# Patient Record
Sex: Female | Born: 1970 | Race: Black or African American | Hispanic: No | Marital: Married | Smoking: Never smoker
Health system: Southern US, Community
[De-identification: ages and names within clinical notes are randomized; demographics above are authoritative.]

## PROBLEM LIST (undated history)

## (undated) DIAGNOSIS — K603 Anal fistula, unspecified: Secondary | ICD-10-CM

## (undated) DIAGNOSIS — I10 Essential (primary) hypertension: Secondary | ICD-10-CM

## (undated) HISTORY — DX: Anal fistula: K60.3

## (undated) HISTORY — DX: Anal fistula, unspecified: K60.30

## (undated) HISTORY — PX: ABDOMINAL HYSTERECTOMY: SHX81

## (undated) HISTORY — DX: Essential (primary) hypertension: I10

---

## 2009-01-20 ENCOUNTER — Emergency Department (HOSPITAL_COMMUNITY): Admission: EM | Admit: 2009-01-20 | Discharge: 2009-01-20 | Payer: Self-pay | Admitting: Emergency Medicine

## 2009-01-26 ENCOUNTER — Emergency Department (HOSPITAL_COMMUNITY): Admission: EM | Admit: 2009-01-26 | Discharge: 2009-01-26 | Payer: Self-pay | Admitting: Emergency Medicine

## 2009-02-06 ENCOUNTER — Other Ambulatory Visit: Admission: RE | Admit: 2009-02-06 | Discharge: 2009-02-06 | Payer: Self-pay | Admitting: Family Medicine

## 2009-04-10 ENCOUNTER — Ambulatory Visit: Payer: Self-pay | Admitting: *Deleted

## 2009-04-10 ENCOUNTER — Emergency Department (HOSPITAL_COMMUNITY): Admission: EM | Admit: 2009-04-10 | Discharge: 2009-04-10 | Payer: Self-pay | Admitting: Emergency Medicine

## 2009-04-10 ENCOUNTER — Inpatient Hospital Stay (HOSPITAL_COMMUNITY): Admission: RE | Admit: 2009-04-10 | Discharge: 2009-04-13 | Payer: Self-pay | Admitting: *Deleted

## 2009-05-23 ENCOUNTER — Emergency Department (HOSPITAL_COMMUNITY): Admission: EM | Admit: 2009-05-23 | Discharge: 2009-05-23 | Payer: Self-pay | Admitting: Family Medicine

## 2009-09-04 ENCOUNTER — Emergency Department (HOSPITAL_COMMUNITY): Admission: EM | Admit: 2009-09-04 | Discharge: 2009-09-04 | Payer: Self-pay | Admitting: Emergency Medicine

## 2009-10-04 ENCOUNTER — Emergency Department (HOSPITAL_COMMUNITY): Admission: EM | Admit: 2009-10-04 | Discharge: 2009-10-04 | Payer: Self-pay | Admitting: Family Medicine

## 2010-02-08 ENCOUNTER — Ambulatory Visit (HOSPITAL_BASED_OUTPATIENT_CLINIC_OR_DEPARTMENT_OTHER): Admission: RE | Admit: 2010-02-08 | Discharge: 2010-02-08 | Payer: Self-pay | Admitting: General Surgery

## 2010-07-29 ENCOUNTER — Emergency Department (HOSPITAL_COMMUNITY)
Admission: EM | Admit: 2010-07-29 | Discharge: 2010-07-29 | Payer: Self-pay | Source: Home / Self Care | Admitting: Emergency Medicine

## 2010-08-14 ENCOUNTER — Emergency Department (HOSPITAL_BASED_OUTPATIENT_CLINIC_OR_DEPARTMENT_OTHER)
Admission: EM | Admit: 2010-08-14 | Discharge: 2010-08-14 | Payer: Self-pay | Source: Home / Self Care | Admitting: Emergency Medicine

## 2010-08-17 ENCOUNTER — Emergency Department (HOSPITAL_COMMUNITY)
Admission: EM | Admit: 2010-08-17 | Discharge: 2010-08-17 | Payer: Self-pay | Source: Home / Self Care | Admitting: Family Medicine

## 2010-11-03 LAB — BASIC METABOLIC PANEL
Calcium: 8.8 mg/dL (ref 8.4–10.5)
Chloride: 105 mEq/L (ref 96–112)
Creatinine, Ser: 0.64 mg/dL (ref 0.4–1.2)
GFR calc Af Amer: >60 mL/min (ref >60)
GFR calc non Af Amer: >60 mL/min (ref >60)
Sodium: 138 mEq/L (ref 135–145)

## 2010-11-03 LAB — GLUCOSE, CAPILLARY

## 2010-11-13 ENCOUNTER — Inpatient Hospital Stay (INDEPENDENT_AMBULATORY_CARE_PROVIDER_SITE_OTHER)
Admission: RE | Admit: 2010-11-13 | Discharge: 2010-11-13 | Disposition: A | Discharge status: Home / Self Care | Payer: Self-pay | Source: Ambulatory Visit | Attending: Family Medicine | Admitting: Family Medicine

## 2010-11-13 DIAGNOSIS — J45909 Unspecified asthma, uncomplicated: Secondary | ICD-10-CM

## 2010-11-23 LAB — CBC
HCT: 40.4 % (ref 36.0–46.0)
MCHC: 33.4 g/dL (ref 30.0–36.0)
Platelets: 280 10*3/uL (ref 150–400)
RBC: 4.62 MIL/uL (ref 3.87–5.11)
RDW: 14.1 % (ref 11.5–15.5)

## 2010-11-23 LAB — URINALYSIS, ROUTINE W REFLEX MICROSCOPIC
Glucose, UA: NEGATIVE mg/dL
Hgb urine dipstick: NEGATIVE
Ketones, ur: 40 mg/dL — AB
Specific Gravity, Urine: 1.025 (ref 1.005–1.030)
Urobilinogen, UA: 0.2 mg/dL (ref 0.0–1.0)

## 2010-11-23 LAB — BASIC METABOLIC PANEL
BUN: 7 mg/dL (ref 6–23)
Creatinine, Ser: 0.61 mg/dL (ref 0.4–1.2)
GFR calc Af Amer: >60 mL/min (ref >60)
Sodium: 137 mEq/L (ref 135–145)

## 2010-11-23 LAB — DIFFERENTIAL
Basophils Relative: 0 % (ref 0–1)
Eosinophils Absolute: 0.3 10*3/uL (ref 0.0–0.7)
Lymphs Abs: 2.9 10*3/uL (ref 0.7–4.0)
Monocytes Relative: 7 % (ref 3–12)

## 2010-11-23 LAB — RAPID URINE DRUG SCREEN, HOSP PERFORMED
Amphetamines: NOT DETECTED
Tetrahydrocannabinol: NOT DETECTED

## 2010-11-23 LAB — POCT PREGNANCY, URINE: Preg Test, Ur: NEGATIVE

## 2010-11-23 LAB — ETHANOL: Alcohol, Ethyl (B): 6 mg/dL (ref 0–10)

## 2010-11-29 ENCOUNTER — Inpatient Hospital Stay (INDEPENDENT_AMBULATORY_CARE_PROVIDER_SITE_OTHER)
Admission: RE | Admit: 2010-11-29 | Discharge: 2010-11-29 | Disposition: A | Discharge status: Home / Self Care | Payer: Self-pay | Source: Ambulatory Visit | Attending: Family Medicine | Admitting: Family Medicine

## 2010-11-29 DIAGNOSIS — J45909 Unspecified asthma, uncomplicated: Secondary | ICD-10-CM

## 2010-12-11 ENCOUNTER — Emergency Department (HOSPITAL_COMMUNITY)
Admission: EM | Admit: 2010-12-11 | Discharge: 2010-12-11 | Disposition: A | Discharge status: Home / Self Care | Payer: Self-pay | Attending: Emergency Medicine | Admitting: Emergency Medicine

## 2010-12-11 ENCOUNTER — Emergency Department (HOSPITAL_COMMUNITY): Payer: Self-pay

## 2010-12-11 DIAGNOSIS — J45909 Unspecified asthma, uncomplicated: Secondary | ICD-10-CM | POA: Insufficient documentation

## 2010-12-11 DIAGNOSIS — IMO0002 Reserved for concepts with insufficient information to code with codable children: Secondary | ICD-10-CM | POA: Insufficient documentation

## 2010-12-11 DIAGNOSIS — I10 Essential (primary) hypertension: Secondary | ICD-10-CM | POA: Insufficient documentation

## 2010-12-11 DIAGNOSIS — Y929 Unspecified place or not applicable: Secondary | ICD-10-CM | POA: Insufficient documentation

## 2010-12-11 DIAGNOSIS — M25559 Pain in unspecified hip: Secondary | ICD-10-CM | POA: Insufficient documentation

## 2010-12-11 DIAGNOSIS — X58XXXA Exposure to other specified factors, initial encounter: Secondary | ICD-10-CM | POA: Insufficient documentation

## 2010-12-31 NOTE — Discharge Summary (Signed)
[name], [name]              ACCOUNT NO.:  [id number]   MEDICAL RECORD NO.:  [id number]          PATIENT TYPE:  IPS   LOCATION:  0307                          FACILITY:  [facility]   PHYSICIAN:  Jasmine Pang, M.D. DATE OF BIRTH:  08/02/1971   DATE OF ADMISSION:  04/10/2009  DATE OF DISCHARGE:  04/13/2009                               DISCHARGE SUMMARY   IDENTIFICATION:  This is a 40 year old single African American female  from [place], who was admitted on a voluntary basis.   HISTORY OF PRESENT ILLNESS:  The patient presents with suicidal ideation  and a plan to overdose on pills.  She reported feeling depressed for the  past 8-9 months.  She is tearful and quiet and reserved, but is  cooperative.  Stressors include her mother and uncle and grandmother's  death within the past few years.  She was very close to all of them.  She is also having financial problems, occupational problems, and family  conflict.  The latter revolves around her being in a relationship with a  same sex partner and her children and other family members not agreeing  with this.  The patient states she just moved from [place] in April.  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  There were no drugs or alcohol use.  She has had  no past psychiatric history.  She has never been on any psychiatric  medicines.  The patient notes that she has been having problems with her  irritability and has felt that her moods have been swinging, somewhat  between being very depressed and becoming easily irritated and  irritable. She states this is not usual for her because she is normally  a calm person.  For further admission information, see psychiatric  admission assessment. Initially, an Axis I diagnosis of mood disorder  NOS was given.  On Axis III, she was diagnosed with seasonal allergies.   PHYSICAL FINDINGS:  There were no acute physical or medical problems  noted.  Complete physical exam was done in the ED.   ADMISSION LABORATORY:  Urine drug screen was negative.  BMET was  unremarkable except for slightly decreased potassium of 3.4.  Alcohol  level was negative.  Urinalysis was within normal limits except for 40  ketones.  She states she has not been eating lately.  Urine pregnancy  test was negative.  CBC with differential was grossly within normal  limits.   HOSPITAL COURSE:  Upon admission, the patient was started on Ambien 10  mg p.o. q.h.s. p.r.n. insomnia and Zyrtec 10 mg p.o. daily p.r.n.,  Ativan 0.5 mg p.o. q.4 h. p.r.n. anxiety.  Initially in individual  sessions, the patient presented as a casually dressed, cooperative, but  reserved [place] female who had fair eye contact.  There was  psychomotor retardation.  Speech was soft and flow.  She was somewhat  lethargic because she had been lying in bed.  Mood was depressed,  anxious, and hopeless.  Affect was consistent with mood.  There  was  positive suicidal ideation.  No homicidal ideation.  Thoughts were  logical and goal-directed.  Thought content, no predominant theme.  No  auditory or visual hallucination.  No paranoia or delusions.  Insight  good.  Judgment good.  Impulse control good.  The patient was started on  Celexa 20 mg p.o. q.h.s. and Abilify 5 mg p.o. q.h.s.  As  hospitalization progressed, mental status improved.  She became less  depressed, less anxious.  She began to attend unit therapeutic groups  and activity.  She attended discharge group and made plans for her to be  seen at [facility] counseling center.  She discussed her family  situation with me; she has a very supportive partner; she was concerned  about her grandchildren, who she has custody of.  She was wanting to go  home very soon.  It was decided to go home tomorrow.  On April 13, 2009, mental status had improved markedly from admission status.  The  patient was sleeping well and eating  well.  Mood was less depressed,  less anxious.  Affect was consistent with mood.  There was no suicidal  or homicidal ideation.  No auditory or visual hallucination.  No  paranoia or delusion.  Thoughts were logical and goal-directed.  Thought  content, no predominant theme.  Cognitive was grossly intact.  Insight  good.  Judgment good.  Impulse control good.  She was having no side  effects to her medication.  She was wanting to go home today and felt to  be safe for discharge.  Her partner was going to pick her up.   DISCHARGE DIAGNOSES:  Axis I:  Mood disorder not otherwise specified.  Axis II:  None.  Axis III:  Seasonal allergies.  Axis IV:  Severe (conflict with family, death of close family members,  financial problems, occupational problem, and burden of psychiatric  illness).  Axis V:  Global assessment of functioning was 50 upon discharge.  GAF  was 38 upon admission.  GAF highest past year was 70-75.   DISCHARGE PLAN:  There was no specific activity level or dietary  restriction.   POSTHOSPITAL CARE PLANS:  The patient will go to [facility] counseling  center on April 17, 2009 at 2 p.m. to see Saul Fordyce, nurse  practitioner.  She will also begin counseling at the center.   DISCHARGE MEDICATIONS:  1. Celexa 20 mg at bedtime.  2. Abilify 5 mg at bedtime.  3. Ambien 10 mg at bedtime.  4. Lorazepam 0.5 mg every 6 hours if needed for anxiety.  5. Zyrtec as directed.      Jasmine Pang, M.D.  Electronically Signed     BHS/MEDQ  D:  04/13/2009  T:  04/14/2009  Job:  [id number]

## 2010-12-31 NOTE — H&P (Signed)
[name], [name]              ACCOUNT NO.:  [id number]   MEDICAL RECORD NO.:  [id number]          PATIENT TYPE:  IPS   LOCATION:  0307                          FACILITY:  [facility]   PHYSICIAN:  Jasmine Pang, M.D. DATE OF BIRTH:  06-Dec-1970   DATE OF ADMISSION:  04/10/2009  DATE OF DISCHARGE:                       PSYCHIATRIC ADMISSION ASSESSMENT   IDENTIFICATION:  This is a 40 year old single African American female  from [place] who was admitted on a voluntary basis.   HISTORY OF PRESENT ILLNESS:  The patient presents with suicidal ideation  and a plan to overdose on pills.  She reported feeling depressed for the  past 8 to 9 months.  She is tearful and quiet and reserved, but is  cooperative.  Stressors include her mother and uncle and grandmother's  death within the past few years.  She was very close to all of them.  She also is having financial problems, occupational problems, and family  conflict.  The ladder revolves around her being in a relationship with  the same sex partner and her children and other family members not  agreeing with this.  The patient states she just moved [place] from  [place] in April.  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  There was no drugs or alcohol use.   PAST PSYCHIATRIC HISTORY:  None.  She has never been on any psychiatric  medication.  The patient notes that she was having problems with her  irritability and has felt that her moods have been swimming somewhat  between being very depressed and becoming easily irritated and  irritable.  She states this is not usual for her because she is normally  a very calm person.   ALCOHOL AND DRUG HISTORY:  None.   SOCIAL HISTORY:  The patient was living in [place] until April.  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  She had stopped this due to  depression and a lot of memories of her family members death.  Apparently, she worked right across the  street from (old family  graveyard).  She then moved to [facility] and is working at a day care  center.  She states she wants to get on her feet and go back to running  a day care center again.  She is the mother of a 11 year old and 19-year-  old.  The 52 year old has 2 children, ages 42 and 2 months and she is  currently taking care of the grandchildren.  She states her daughter is  not doing well.  She did not go into detail about what her problems  were.   FAMILY HISTORY:  The patient denies any history of mood problems or  alcohol or drug abuse.   MEDICAL PROBLEMS:  Seasonal allergies.   MEDICATIONS:  Zyrtec 10 mg p.o. q. day p.r.n.   ALLERGIES:  No known drug allergies.   PHYSICAL FINDINGS:  There were no acute physical or medical problems  noted in the ED.   ADMISSION LABORATORY DATA:  Urine drug screen was negative.  BMET  was  unremarkable except for slightly decreased potassium of 3.4.  Alcohol  level was negative.  Urinalysis was within normal limits except for 40  ketones.  She states she has not been eating lately.  Urine pregnancy  test was negative.  CBC with differential was grossly within normal  limits.   MENTAL STATUS EXAM:  The patient presented as a casually dressed,  cooperative, but reserved, African American female who had fair eye  contact.  There was psychomotor retardation.  Speech was soft and slow.  She was somewhat lethargic because she had been lying in bed.  Mood was  depressed, anxious, hopeless.  Affect was consistent with mood.  There  was positive suicidal ideation.  No homicidal ideation.  Thoughts were  logical and goal-directed.  Thought content no predominant theme.  No  auditory or visual hallucinations.  No paranoia or delusions.  Insight  good.  Judgment good.  Impulse control good.   ADMISSION DIAGNOSES:  Axis I:  Mood disorder, not otherwise specified.  Axis II:  Deferred.  Axis III:  Seasonal allergies.  Axis IV:  Severe (conflict  with family, deaths close family members,  financial problems, occupational problems).  Axis V:  Global assessment of functioning 38 upon admission.  Highest  past year was 70 to 75.   POSTHOSPITAL CARE PLANS:  The patient will be referred for therapy and  psychiatric medication management by our case manager possibly to the  [facility].   ANTICIPATED LENGTH OF STAY:  3 to 5 days.   CONDITION NECESSARY FOR DISCHARGE:  Not suicidal.  Less depressed and  more functional.   HOSPITAL PLANS:  The patient will be started on Celexa 20 mg daily and  Abilify 5 mg daily.  She will be involved in unit therapeutic groups and  activities.  She will be in the blue group (for depression).  We will  have a family session with her and her partner.      Jasmine Pang, M.D.  Electronically Signed     BHS/MEDQ  D:  04/11/2009  T:  04/12/2009  Job:  [id number]

## 2011-02-22 ENCOUNTER — Inpatient Hospital Stay (INDEPENDENT_AMBULATORY_CARE_PROVIDER_SITE_OTHER)
Admission: RE | Admit: 2011-02-22 | Discharge: 2011-02-22 | Disposition: A | Discharge status: Home / Self Care | Payer: Self-pay | Source: Ambulatory Visit | Attending: Family Medicine | Admitting: Family Medicine

## 2011-02-22 DIAGNOSIS — J309 Allergic rhinitis, unspecified: Secondary | ICD-10-CM

## 2011-09-01 ENCOUNTER — Ambulatory Visit: Payer: Worker's Compensation

## 2011-09-04 ENCOUNTER — Ambulatory Visit: Payer: Worker's Compensation

## 2011-09-06 ENCOUNTER — Ambulatory Visit: Payer: Worker's Compensation

## 2011-09-11 ENCOUNTER — Emergency Department (INDEPENDENT_AMBULATORY_CARE_PROVIDER_SITE_OTHER)
Admission: EM | Admit: 2011-09-11 | Discharge: 2011-09-11 | Disposition: A | Discharge status: Home / Self Care | Payer: BC Managed Care – PPO | Source: Home / Self Care | Attending: Family Medicine | Admitting: Family Medicine

## 2011-09-11 ENCOUNTER — Telehealth (HOSPITAL_COMMUNITY): Payer: Self-pay | Admitting: *Deleted

## 2011-09-11 ENCOUNTER — Encounter (HOSPITAL_COMMUNITY): Payer: Self-pay

## 2011-09-11 DIAGNOSIS — J329 Chronic sinusitis, unspecified: Secondary | ICD-10-CM

## 2011-09-11 MED ORDER — AZITHROMYCIN 250 MG PO TABS: 250.0000 mg | ORAL_TABLET | Freq: Every day | ORAL | Status: AC

## 2011-09-11 MED ORDER — GUAIFENESIN-CODEINE 100-10 MG/5ML PO SYRP: 5.0000 mL | ORAL_SOLUTION | Freq: Four times a day (QID) | ORAL | Status: AC | PRN

## 2011-09-11 MED ORDER — ONDANSETRON HCL 4 MG PO TABS: 4.0000 mg | ORAL_TABLET | Freq: Three times a day (TID) | ORAL | Status: AC | PRN

## 2011-09-11 NOTE — ED Notes (Signed)
C/o productive cough of green sputum, sore throat, body aches that started last pm.  States she also vomited x 1 last night.  Denies diarrhea

## 2011-09-11 NOTE — ED Provider Notes (Signed)
History     CSN: [id number]  Arrival date & time 09/11/11  0820   First MD Initiated Contact with Patient 09/11/11 [phone]      Chief Complaint  Patient presents with  . Cough    (Consider location/radiation/quality/duration/timing/severity/associated sxs/prior treatment) HPI Comments: Armina presents for evaluation of persistent productive cough, fever, and nausea over the last few days. She reports a similar episode several weeks ago that resolved without seeing a medical provider. She does not smoke, and denies any sick contacts. She does have asthma and HTN and uses albuterol PRN.   Patient is a 41 y.o. female presenting with cough. The history is provided by the patient.  Cough This is a new problem. The current episode started more than 2 days ago. The problem occurs constantly. The problem has not changed since onset.The cough is productive of sputum. The maximum temperature recorded prior to her arrival was 101 to 101.9 F. Associated symptoms include rhinorrhea and shortness of breath. Pertinent negatives include no chest pain, no sore throat and no wheezing. She is not a smoker.    Past Medical History  Diagnosis Date  . Asthma     Past Surgical History  Procedure Date  . Abdominal hysterectomy     No family history on file.  History  Substance Use Topics  . Smoking status: Never Smoker   . Smokeless tobacco: Not on file  . Alcohol Use: No    OB History    Grav Para Term Preterm Abortions TAB SAB Ect Mult Living                  Review of Systems  Constitutional: Negative.   HENT: Positive for congestion and rhinorrhea. Negative for sore throat and sinus pressure.   Eyes: Negative.   Respiratory: Positive for cough and shortness of breath. Negative for wheezing.   Cardiovascular: Negative.  Negative for chest pain.  Gastrointestinal: Positive for nausea. Negative for vomiting.  Genitourinary: Negative.   Musculoskeletal: Negative.   Skin: Negative.     Neurological: Negative.     Allergies  Review of patient's allergies indicates no known allergies.  Home Medications   Current Outpatient Rx  Name Route Sig Dispense Refill  . ALBUTEROL IN Inhalation Inhale into the lungs as needed.    . AZITHROMYCIN 250 MG PO TABS Oral Take 1 tablet (250 mg total) by mouth daily. Take two tablets on first day, then one tablet each day for four days 6 tablet 0  . GUAIFENESIN-CODEINE 100-10 MG/5ML PO SYRP Oral Take 5 mLs by mouth every 6 (six) hours as needed for cough or congestion. 120 mL 0  . ONDANSETRON HCL 4 MG PO TABS Oral Take 1 tablet (4 mg total) by mouth every 8 (eight) hours as needed for nausea. 15 tablet 0    BP 158/92  Pulse 101  Temp(Src) 100.1 F (37.8 C) (Oral)  Resp 18  SpO2 96%  Physical Exam  Nursing note and vitals reviewed. Constitutional: She is oriented to person, place, and time. She appears well-developed and well-nourished.  HENT:  Head: Normocephalic and atraumatic.  Right Ear: Tympanic membrane is retracted.  Left Ear: Tympanic membrane is retracted.  Mouth/Throat: Uvula is midline, oropharynx is clear and moist and mucous membranes are normal.  Eyes: EOM are normal.  Neck: Normal range of motion.  Pulmonary/Chest: Effort normal and breath sounds normal. She has no decreased breath sounds. She has no wheezes. She has no rhonchi. She has no rales.  Musculoskeletal: Normal range of motion.  Neurological: She is alert and oriented to person, place, and time.  Skin: Skin is warm and dry.  Psychiatric: Her behavior is normal.    ED Course  Procedures (including critical care time)  Labs Reviewed - No data to display No results found.   1. Rhinosinusitis       MDM  Given rx for ondansetron for nausea, guaifenesin AC for cough, and delayed rx for azithromycin, if no improvement in sx        Richardo Priest, MD 09/11/11 [phone]

## 2011-10-13 ENCOUNTER — Emergency Department (INDEPENDENT_AMBULATORY_CARE_PROVIDER_SITE_OTHER): Payer: BC Managed Care – PPO

## 2011-10-13 ENCOUNTER — Emergency Department (HOSPITAL_COMMUNITY)
Admission: EM | Admit: 2011-10-13 | Discharge: 2011-10-13 | Disposition: A | Discharge status: Home / Self Care | Payer: BC Managed Care – PPO | Source: Home / Self Care | Attending: Family Medicine | Admitting: Family Medicine

## 2011-10-13 ENCOUNTER — Encounter (HOSPITAL_COMMUNITY): Payer: Self-pay | Admitting: *Deleted

## 2011-10-13 DIAGNOSIS — J45901 Unspecified asthma with (acute) exacerbation: Secondary | ICD-10-CM

## 2011-10-13 MED ORDER — METHYLPREDNISOLONE ACETATE 80 MG/ML IJ SUSP
INTRAMUSCULAR | Status: AC
  Filled 2011-10-13: qty 1

## 2011-10-13 MED ORDER — ALBUTEROL SULFATE (5 MG/ML) 0.5% IN NEBU
INHALATION_SOLUTION | RESPIRATORY_TRACT | Status: AC
  Filled 2011-10-13: qty 1

## 2011-10-13 MED ORDER — TRIAMCINOLONE ACETONIDE 40 MG/ML IJ SUSP
40.0000 mg | Freq: Once | INTRAMUSCULAR | Status: AC
  Administered 2011-10-13: 40 mg via INTRAMUSCULAR

## 2011-10-13 MED ORDER — METHYLPREDNISOLONE ACETATE 40 MG/ML IJ SUSP
80.0000 mg | Freq: Once | INTRAMUSCULAR | Status: AC
  Administered 2011-10-13: 80 mg via INTRAMUSCULAR

## 2011-10-13 MED ORDER — ALBUTEROL SULFATE (5 MG/ML) 0.5% IN NEBU
5.0000 mg | INHALATION_SOLUTION | Freq: Once | RESPIRATORY_TRACT | Status: AC
  Administered 2011-10-13: 5 mg via RESPIRATORY_TRACT

## 2011-10-13 MED ORDER — TRIAMCINOLONE ACETONIDE 40 MG/ML IJ SUSP
INTRAMUSCULAR | Status: AC
  Filled 2011-10-13: qty 5

## 2011-10-13 MED ORDER — IPRATROPIUM BROMIDE 0.02 % IN SOLN
0.5000 mg | Freq: Once | RESPIRATORY_TRACT | Status: AC
  Administered 2011-10-13: 0.5 mg via RESPIRATORY_TRACT

## 2011-10-13 NOTE — ED Provider Notes (Signed)
History     CSN: [id number]  Arrival date & time 10/13/11  1457   First MD Initiated Contact with Patient 10/13/11 1459      Chief Complaint  Patient presents with  . Cough    (Consider location/radiation/quality/duration/timing/severity/associated sxs/prior treatment) Patient is a 41 y.o. female presenting with cough. The history is provided by the patient.  Cough This is a recurrent problem. The current episode started more than 1 week ago (seen 1/24 but sx continue.). The problem has not changed since onset.The cough is non-productive. There has been no fever. Associated symptoms include shortness of breath. Pertinent negatives include no rhinorrhea, no sore throat and no wheezing. She is not a smoker. Her past medical history is significant for asthma.    Past Medical History  Diagnosis Date  . Asthma     Past Surgical History  Procedure Date  . Abdominal hysterectomy     History reviewed. No pertinent family history.  History  Substance Use Topics  . Smoking status: Never Smoker   . Smokeless tobacco: Not on file  . Alcohol Use: No    OB History    Grav Para Term Preterm Abortions TAB SAB Ect Mult Living                  Review of Systems  Constitutional: Negative.   HENT: Negative for sore throat and rhinorrhea.   Respiratory: Positive for cough and shortness of breath. Negative for wheezing.   Cardiovascular: Negative.   Gastrointestinal: Negative.     Allergies  Review of patient's allergies indicates no known allergies.  Home Medications   Current Outpatient Rx  Name Route Sig Dispense Refill  . ALBUTEROL IN Inhalation Inhale into the lungs as needed.      BP 146/87  Pulse 98  Temp(Src) 98.5 F (36.9 C) (Oral)  Resp 18  SpO2 100%  Physical Exam  Nursing note and vitals reviewed. Constitutional: She is oriented to person, place, and time. She appears well-developed and well-nourished.  HENT:  Head: Normocephalic.  Right Ear: External  ear normal.  Left Ear: External ear normal.  Mouth/Throat: Oropharynx is clear and moist.  Eyes: Pupils are equal, round, and reactive to light.  Neck: Normal range of motion. Neck supple.  Cardiovascular: Normal rate, normal heart sounds and intact distal pulses.   Pulmonary/Chest: Effort normal and breath sounds normal. No respiratory distress. She has no wheezes. She has no rales.  Abdominal: Soft. Bowel sounds are normal.  Lymphadenopathy:    She has no cervical adenopathy.  Neurological: She is alert and oriented to person, place, and time.  Skin: Skin is warm and dry.  Psychiatric: She has a normal mood and affect.    ED Course  Procedures (including critical care time)  Labs Reviewed - No data to display No results found.   1. Asthma exacerbation, mild       MDM   X-rays reviewed and report per radiologist. Sx improved and lungs improved breathing without cough after neb.        Barkley Bruns, MD 10/18/11 [phone]

## 2011-10-13 NOTE — ED Notes (Signed)
Pt  Reports  Symptoms  Of cough  Congestion     Shortness  Of  Breath        Tightness  In  Chest       Symptoms        X  3  Days       She  Is  Awake  As  Well  As  Alert and  Oriented   Speaking in  Complete  sentances skin is  Warm /  Dry        Pt  Has  History of  Asthma  She uses albuterol

## 2011-11-04 ENCOUNTER — Other Ambulatory Visit: Payer: Self-pay | Admitting: Family Medicine

## 2011-11-04 ENCOUNTER — Other Ambulatory Visit (HOSPITAL_COMMUNITY)
Admission: RE | Admit: 2011-11-04 | Discharge: 2011-11-04 | Disposition: A | Discharge status: Home / Self Care | Payer: BC Managed Care – PPO | Source: Ambulatory Visit | Attending: Family Medicine | Admitting: Family Medicine

## 2011-11-04 DIAGNOSIS — Z1159 Encounter for screening for other viral diseases: Secondary | ICD-10-CM | POA: Insufficient documentation

## 2011-11-04 DIAGNOSIS — Z01419 Encounter for gynecological examination (general) (routine) without abnormal findings: Secondary | ICD-10-CM | POA: Insufficient documentation

## 2011-11-10 ENCOUNTER — Telehealth (INDEPENDENT_AMBULATORY_CARE_PROVIDER_SITE_OTHER): Payer: Self-pay | Admitting: General Surgery

## 2011-11-10 NOTE — Telephone Encounter (Signed)
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  Pt needs appt ASAP with Dr. Lindie Spruce.  Call her at [phone].

## 2011-11-25 ENCOUNTER — Ambulatory Visit (INDEPENDENT_AMBULATORY_CARE_PROVIDER_SITE_OTHER): Payer: BC Managed Care – PPO | Admitting: General Surgery

## 2011-11-25 ENCOUNTER — Encounter (INDEPENDENT_AMBULATORY_CARE_PROVIDER_SITE_OTHER): Payer: Self-pay | Admitting: General Surgery

## 2011-11-25 VITALS — BP 130/88 | HR 80 | Temp 97.2°F | Resp 16 | Ht 64.0 in | Wt 213.0 lb

## 2011-11-25 DIAGNOSIS — K603 Anal fistula: Secondary | ICD-10-CM

## 2011-11-25 DIAGNOSIS — K605 Anorectal fistula: Secondary | ICD-10-CM | POA: Insufficient documentation

## 2011-11-25 NOTE — Progress Notes (Signed)
HPI The patient comes in with complaints of swelling and drainage from her previously drained and open fistulotomy site.  PE On examination today with a digital examination only without an anoscope the patient had a well-healed fistulotomy site no palpable fluctuance and no ability to express any drainage.  Studiy review There no studies to review.  Assessment Recurrent perianal pain and discomfort with drainage and swelling in the previous fistulotomy site. It is possible that the patient has developed a recurrent anorectal fistula however there is no clinical evidence of that at this time.  Plan The patient and I want to wait to see she should develop recurrent symptoms and call me back for scheduling a repeat exam under anesthesia and fistulotomy. I am very sure that we opened up her fistulous tract the last time however she may have been secondary site that needs to be explored.  I will wait for the patient's phone call.

## 2011-11-25 NOTE — Patient Instructions (Signed)
The patient will call me if she has recurrent symptoms. This includes increasing perianal or rectal pain. This includes pain with bowel movements. This includes any drainage or bleeding from the perianal area. At that time we will go ahead and schedule her for an examination under anesthesia and likely repeat fistulotomy

## 2011-12-05 ENCOUNTER — Telehealth (INDEPENDENT_AMBULATORY_CARE_PROVIDER_SITE_OTHER): Payer: Self-pay | Admitting: General Surgery

## 2011-12-05 ENCOUNTER — Other Ambulatory Visit (INDEPENDENT_AMBULATORY_CARE_PROVIDER_SITE_OTHER): Payer: Self-pay | Admitting: General Surgery

## 2011-12-05 NOTE — Telephone Encounter (Signed)
Patient can go ahead and be scheduled. Orders sent to [facility].

## 2011-12-05 NOTE — Telephone Encounter (Signed)
Pt calling about her fistula.  She saw Dr. Lindie Spruce on 11/25/11 and he mentioned surgery at that time.  She is having trouble with "it feels like a blister" and "its hard to walk" because of the pain.  She wants to go forward with surgery.  Please advise if she needs to come back in or not.

## 2011-12-11 ENCOUNTER — Encounter (HOSPITAL_BASED_OUTPATIENT_CLINIC_OR_DEPARTMENT_OTHER): Payer: Self-pay | Admitting: *Deleted

## 2011-12-11 NOTE — Progress Notes (Signed)
To come in for bmet and possible ekg To bring name of new htn med

## 2011-12-12 ENCOUNTER — Encounter (HOSPITAL_BASED_OUTPATIENT_CLINIC_OR_DEPARTMENT_OTHER)
Admission: RE | Admit: 2011-12-12 | Discharge: 2011-12-12 | Disposition: A | Discharge status: Home / Self Care | Payer: BC Managed Care – PPO | Source: Ambulatory Visit | Attending: General Surgery | Admitting: General Surgery

## 2011-12-12 LAB — BASIC METABOLIC PANEL
BUN: 10 mg/dL (ref 6–23)
Chloride: 100 mEq/L (ref 96–112)
GFR calc Af Amer: >90 mL/min (ref >90)
GFR calc non Af Amer: >90 mL/min (ref >90)
Potassium: 3.7 mEq/L (ref 3.5–5.1)
Sodium: 138 mEq/L (ref 135–145)

## 2011-12-15 ENCOUNTER — Ambulatory Visit (HOSPITAL_BASED_OUTPATIENT_CLINIC_OR_DEPARTMENT_OTHER)
Admission: RE | Admit: 2011-12-15 | Discharge: 2011-12-15 | Disposition: A | Discharge status: Home / Self Care | Payer: BC Managed Care – PPO | Source: Ambulatory Visit | Attending: General Surgery | Admitting: General Surgery

## 2011-12-15 ENCOUNTER — Telehealth (INDEPENDENT_AMBULATORY_CARE_PROVIDER_SITE_OTHER): Payer: Self-pay | Admitting: General Surgery

## 2011-12-15 ENCOUNTER — Ambulatory Visit (HOSPITAL_BASED_OUTPATIENT_CLINIC_OR_DEPARTMENT_OTHER): Payer: BC Managed Care – PPO | Admitting: Anesthesiology

## 2011-12-15 ENCOUNTER — Encounter (HOSPITAL_BASED_OUTPATIENT_CLINIC_OR_DEPARTMENT_OTHER): Payer: Self-pay

## 2011-12-15 ENCOUNTER — Encounter (HOSPITAL_BASED_OUTPATIENT_CLINIC_OR_DEPARTMENT_OTHER)
Admission: RE | Disposition: A | Discharge status: Home / Self Care | Payer: Self-pay | Source: Ambulatory Visit | Attending: General Surgery

## 2011-12-15 ENCOUNTER — Encounter (HOSPITAL_BASED_OUTPATIENT_CLINIC_OR_DEPARTMENT_OTHER): Payer: Self-pay | Admitting: Anesthesiology

## 2011-12-15 ENCOUNTER — Other Ambulatory Visit: Payer: Self-pay

## 2011-12-15 DIAGNOSIS — K603 Anal fistula, unspecified: Secondary | ICD-10-CM | POA: Insufficient documentation

## 2011-12-15 DIAGNOSIS — K605 Anorectal fistula: Secondary | ICD-10-CM

## 2011-12-15 DIAGNOSIS — J45909 Unspecified asthma, uncomplicated: Secondary | ICD-10-CM | POA: Insufficient documentation

## 2011-12-15 DIAGNOSIS — I10 Essential (primary) hypertension: Secondary | ICD-10-CM | POA: Insufficient documentation

## 2011-12-15 HISTORY — PX: ANAL FISTULECTOMY: SHX1139

## 2011-12-15 SURGERY — EXAM UNDER ANESTHESIA WITH ANAL FISTULECTOMY
Anesthesia: General | Site: Rectum | Wound class: Clean Contaminated

## 2011-12-15 MED ORDER — ONDANSETRON HCL 4 MG/2ML IJ SOLN
INTRAMUSCULAR | Status: DC | PRN
  Administered 2011-12-15: 4 mg via INTRAVENOUS

## 2011-12-15 MED ORDER — FENTANYL CITRATE 0.05 MG/ML IJ SOLN
INTRAMUSCULAR | Status: DC | PRN
  Administered 2011-12-15: 50 ug via INTRAVENOUS
  Administered 2011-12-15: 25 ug via INTRAVENOUS
  Administered 2011-12-15: 100 ug via INTRAVENOUS
  Administered 2011-12-15: 25 ug via INTRAVENOUS

## 2011-12-15 MED ORDER — DEXAMETHASONE SODIUM PHOSPHATE 4 MG/ML IJ SOLN
INTRAMUSCULAR | Status: DC | PRN
  Administered 2011-12-15: 10 mg via INTRAVENOUS

## 2011-12-15 MED ORDER — DIBUCAINE 1 % RE OINT
TOPICAL_OINTMENT | RECTAL | Status: DC | PRN
  Administered 2011-12-15: 1 via RECTAL

## 2011-12-15 MED ORDER — DEXTROSE 5 % IV SOLN
2.0000 g | INTRAVENOUS | Status: AC
  Administered 2011-12-15: 2 g via INTRAVENOUS

## 2011-12-15 MED ORDER — PROPOFOL 10 MG/ML IV EMUL
INTRAVENOUS | Status: DC | PRN
  Administered 2011-12-15: 200 mg via INTRAVENOUS

## 2011-12-15 MED ORDER — MIDAZOLAM HCL 5 MG/5ML IJ SOLN
INTRAMUSCULAR | Status: DC | PRN
  Administered 2011-12-15: 2 mg via INTRAVENOUS

## 2011-12-15 MED ORDER — LACTATED RINGERS IV SOLN
INTRAVENOUS | Status: DC
  Administered 2011-12-15: 13:00:00 via INTRAVENOUS

## 2011-12-15 MED ORDER — LIDOCAINE HCL (CARDIAC) 20 MG/ML IV SOLN
INTRAVENOUS | Status: DC | PRN
  Administered 2011-12-15: 50 mg via INTRAVENOUS

## 2011-12-15 MED ORDER — HYDROMORPHONE HCL PF 1 MG/ML IJ SOLN
0.2500 mg | INTRAMUSCULAR | Status: DC | PRN
  Administered 2011-12-15 (×2): 0.5 mg via INTRAVENOUS

## 2011-12-15 MED ORDER — FLEET ENEMA 7-19 GM/118ML RE ENEM: 1.0000 | ENEMA | Freq: Once | RECTAL | Status: DC

## 2011-12-15 MED ORDER — HYDROCODONE-ACETAMINOPHEN 5-325 MG PO TABS
1.0000 | ORAL_TABLET | Freq: Once | ORAL | Status: AC | PRN
  Administered 2011-12-15: 1 via ORAL

## 2011-12-15 MED ORDER — HYDROCODONE-ACETAMINOPHEN 7.5-500 MG PO TABS: 1.0000 | ORAL_TABLET | Freq: Four times a day (QID) | ORAL | Status: AC | PRN

## 2011-12-15 MED ORDER — SURGILUBE EX GEL
CUTANEOUS | Status: DC | PRN
  Administered 2011-12-15: 1 via TOPICAL

## 2011-12-15 MED ORDER — ONDANSETRON HCL 4 MG/2ML IJ SOLN: 4.0000 mg | Freq: Four times a day (QID) | INTRAMUSCULAR | Status: DC | PRN

## 2011-12-15 SURGICAL SUPPLY — 47 items
APPLICATOR COTTON TIP 6IN STRL (MISCELLANEOUS) IMPLANT
BENZOIN TINCTURE PRP APPL 2/3 (GAUZE/BANDAGES/DRESSINGS) ×4 IMPLANT
BLADE SURG 15 STRL LF DISP TIS (BLADE) ×1 IMPLANT
BLADE SURG 15 STRL SS (BLADE) ×1
BRIEF STRETCH FOR OB PAD LRG (UNDERPADS AND DIAPERS) ×2 IMPLANT
CANISTER SUCTION 1200CC (MISCELLANEOUS) IMPLANT
CLEANER CAUTERY TIP 5X5 PAD (MISCELLANEOUS) ×1 IMPLANT
CLIP TI MEDIUM 6 (CLIP) IMPLANT
CLIP TI WIDE RED SMALL 6 (CLIP) IMPLANT
CLOTH BEACON ORANGE TIMEOUT ST (SAFETY) IMPLANT
COVER MAYO STAND STRL (DRAPES) IMPLANT
COVER TABLE BACK 60X90 (DRAPES) IMPLANT
DECANTER SPIKE VIAL GLASS SM (MISCELLANEOUS) IMPLANT
DRAPE PED LAPAROTOMY (DRAPES) IMPLANT
DRAPE UTILITY XL STRL (DRAPES) ×2 IMPLANT
DRSG PAD ABDOMINAL 8X10 ST (GAUZE/BANDAGES/DRESSINGS) ×2 IMPLANT
ELECT REM PT RETURN 9FT ADLT (ELECTROSURGICAL) ×2
ELECTRODE REM PT RTRN 9FT ADLT (ELECTROSURGICAL) ×1 IMPLANT
GAUZE SPONGE 4X4 12PLY STRL LF (GAUZE/BANDAGES/DRESSINGS) IMPLANT
GAUZE SPONGE 4X4 16PLY XRAY LF (GAUZE/BANDAGES/DRESSINGS) IMPLANT
GLOVE BIO SURGEON STRL SZ 6.5 (GLOVE) ×4 IMPLANT
GLOVE BIOGEL PI IND STRL 8 (GLOVE) ×1 IMPLANT
GLOVE BIOGEL PI INDICATOR 8 (GLOVE) ×1
GLOVE ECLIPSE 7.5 STRL STRAW (GLOVE) ×2 IMPLANT
GOWN PREVENTION PLUS XLARGE (GOWN DISPOSABLE) ×4 IMPLANT
LOOP VESSEL MAXI BLUE (MISCELLANEOUS) IMPLANT
NEEDLE HYPO 25X1 1.5 SAFETY (NEEDLE) IMPLANT
NS IRRIG 1000ML POUR BTL (IV SOLUTION) IMPLANT
PACK BASIN DAY SURGERY FS (CUSTOM PROCEDURE TRAY) ×2 IMPLANT
PACK LITHOTOMY IV (CUSTOM PROCEDURE TRAY) ×2 IMPLANT
PAD CLEANER CAUTERY TIP 5X5 (MISCELLANEOUS) ×1
PENCIL BUTTON HOLSTER BLD 10FT (ELECTRODE) ×2 IMPLANT
SPONGE SURGIFOAM ABS GEL 100 (HEMOSTASIS) ×2 IMPLANT
SURGILUBE 2OZ TUBE FLIPTOP (MISCELLANEOUS) ×2 IMPLANT
SUT CHROMIC 3 0 SH 27 (SUTURE) IMPLANT
SUT SILK 0 TIES 10X30 (SUTURE) ×2 IMPLANT
SYR BULB 3OZ (MISCELLANEOUS) IMPLANT
SYR CONTROL 10ML LL (SYRINGE) IMPLANT
TAPE CLOTH 2X10 TAN LF (GAUZE/BANDAGES/DRESSINGS) ×2 IMPLANT
TAPE UMBILICAL 1/8 X36 TWILL (MISCELLANEOUS) IMPLANT
TOWEL OR 17X24 6PK STRL BLUE (TOWEL DISPOSABLE) ×2 IMPLANT
TOWEL OR NON WOVEN STRL DISP B (DISPOSABLE) ×2 IMPLANT
TRAY PROCTOSCOPIC FIBER OPTIC (SET/KITS/TRAYS/PACK) IMPLANT
TUBE CONNECTING 20X1/4 (TUBING) ×2 IMPLANT
UNDERPAD 30X30 INCONTINENT (UNDERPADS AND DIAPERS) ×2 IMPLANT
WATER STERILE IRR 1000ML POUR (IV SOLUTION) IMPLANT
YANKAUER SUCT BULB TIP NO VENT (SUCTIONS) ×2 IMPLANT

## 2011-12-15 NOTE — Discharge Instructions (Addendum)
Anal Fistula An anal fistula is an abnormal tunnel that leads from the anal canal (which carries stool from the large intestine) to a hole in the skin near the anus (the opening through which stool passes out of your body).  CAUSES  Food you eat goes from your stomach into your intestine. As the food is digested, waste material (stool) forms. Stool passes through your large intestine, through the rectum and anal canal, and out of your body through the anus.  The anus has a number of tiny glands (clusters of specialized cells) that make lubricating fluid. Sometimes these glands can become infected. This type of infection may lead to the development of a pocket of pus (abscess). An anal fistula often develops after an infection or abscess; It is nearly always caused by a past anorectal abscess. You are at a higher risk of developing an anal fistula if you have:  Had an anal abscess.   Chronic inflammatory bowel disease, such as Crohn's disease or ulcerative colitis.   Conditions in which there are inflamed outpouchings of the intestinal wall (diverticulitis).   Colon or rectal cancer.   Sexually transmitted diseases involving the rectum, such as gonorrhea or chlamydia.   A history of anal radiation treatments, injury, or surgery.   An HIV infection.   A problem that has required treatment with steroid medicines for more than a short time.  SYMPTOMS   Anal pain, particularly around the area of a past abscess.   Drainage of pus, blood, stool or mucus from an opening in the skin.   Swelling around the skin opening.   Worn off skin around the opening.   A hot or red area near the anus.   Diarrhea.   Fever and chills.   Tiredness (fatigue).  DIAGNOSIS   In some cases, the opening of an anal fistula is easily seen during a physical exam.   A probe or scope may be used to help locate the opening of the fistula. In some cases, dye can be injected into the fistula opening, and X-rays  can be taken to find the exact location and path of the fistula.   A sample (biopsy) of the fistula tissue or anus may be taken to check for cancer.  TREATMENT   An anal fistula may need surgery to open it up and allow it to heal. This type of operation is called a fistulotomy.   A specialized kind of glue or plug to seal the fistula may be used.   An antibiotic may be prescribed to treat an existing infection.  HOME CARE INSTRUCTIONS   Take medications (such as antibiotics) as prescribed by your caregiver.   Only take over-the-counter or prescription medicine for pain, discomfort, or fever as directed by your caregiver.   Follow your prescribed diet. You may need a higher fiber diet to help avoid constipation.   Drink lots of water as directed.   Use a stool softener or laxative, if recommended.   A warm sitz bath several times a day may be soothing, as well as help with healing.   Follow excellent hygiene to keep the anal area as clean as possible. Consider using pre-moistened towelettes to keep the anal area clean after using the bathroom.  SEEK MEDICAL CARE IF:  You have increased pain not controlled with medications.   You notice new swelling, redness, or hotness in the anal area.   You develop any problems passing urine.   You develop a fever (more   than 100.5 F (38.1 C).  SEEK IMMEDIATE MEDICAL CARE IF:  You have severe, intolerable pain.   You have severe problems passing urine or cannot pass any urine at all.   You develop an unexplained oral temperature above 102.0 F (38.9 C).   You notice new or worsening leakage of blood, pus, mucus, or stool.  Document Released: 07/17/2008 Document Revised: 07/24/2011 Document Reviewed: 07/17/2008 [facility] Patient Information 2012 [place], [place].   Post Anesthesia Home Care Instructions  Activity: Get plenty of rest for the remainder of the day. A responsible adult should stay with you for 24 hours following the  procedure.  For the next 24 hours, DO NOT: -Drive a car -Advertising copywriter -Drink alcoholic beverages -Take any medication unless instructed by your physician -Make any legal decisions or sign important papers.  Meals: Start with liquid foods such as gelatin or soup. Progress to regular foods as tolerated. Avoid greasy, spicy, heavy foods. If nausea and/or vomiting occur, drink only clear liquids until the nausea and/or vomiting subsides. Call your physician if vomiting continues.  Special Instructions/Symptoms: Your throat may feel dry or sore from the anesthesia or the breathing tube placed in your throat during surgery. If this causes discomfort, gargle with warm salt water. The discomfort should disappear within 24 hours.

## 2011-12-15 NOTE — Transfer of Care (Signed)
Immediate Anesthesia Transfer of Care Note  Patient: [name]  Procedure(s) Performed: Procedure(s) (LRB): EXAM UNDER ANESTHESIA WITH ANAL FISTULECTOMY (N/A)  Patient Location: PACU  Anesthesia Type: General  Level of Consciousness: awake, alert  and oriented  Airway & Oxygen Therapy: Patient Spontanous Breathing and Patient connected to face mask oxygen  Post-op Assessment: Report given to PACU RN and Post -op Vital signs reviewed and stable  Post vital signs: Reviewed and stable  Complications: No apparent anesthesia complications

## 2011-12-15 NOTE — Op Note (Signed)
OPERATIVE REPORT  DATE OF OPERATION: 12/15/2011  PATIENT:  Charlesetta Ivory Waldeck  41 y.o. female  PRE-OPERATIVE DIAGNOSIS:  Recurrent anal fistula and pain  POST-OPERATIVE DIAGNOSIS:  Recurrent anal fistula and pain  PROCEDURE:  Procedure(s): EXAM UNDER ANESTHESIA WITH PARTIAL ANAL FISTULOTOMY AND SETON PLACEMENT  SURGEON:  Surgeon(s): Cherylynn Ridges, MD  ASSISTANT: None  ANESTHESIA:   general  EBL: <30 ml  BLOOD ADMINISTERED: none  DRAINS: none and SETON left at 5:00 position with 1200 being anterior   SPECIMEN:  No Specimen  COUNTS CORRECT:  YES  PROCEDURE DETAILS: The patient was taken to the operating room and placed on the table in the supine position. After an adequate general laryngeal airway anesthetic was administered she was placed in the lithotomy position her gluteal cheeks were taped apart prepped and draped in usual sterile manner exposing the perianal area.  The patient had a fistulous opening that was just to the right of the posterior midline of the perianal area. We probed this area amount for the tract. It initially went deep to the internal sphincter muscle. It coursed medially across midline and came out the rectal mucosa above the dentate line on the patient's left side. So started off posteriorly on writing cannot internally on the left. It did involve the internal sphincter. Because of this did not want to cut across this and cause possible incontinence in the posterior midline. There are fine 0 silk seton sutures x2 were tied around this area. The tract was widened using a hemostat clamp and we debrided the tract using a curet. Again care was taken not to cut the sphincter muscle.  Once this was done we inspected further for any areas of fistulization. Number noted. We used a Gelfoam roll with dibucaine ointment on top and placed in the anal area. All counts were correct.  PATIENT DISPOSITION:  PACU - hemodynamically stable.   Rayneisha Bouza III,Bobbi Yount O 4/29/20132:20  PM

## 2011-12-15 NOTE — H&P (View-Only) (Signed)
HPI The patient comes in with complaints of swelling and drainage from her previously drained and open fistulotomy site.  PE On examination today with a digital examination only without an anoscope the patient had a well-healed fistulotomy site no palpable fluctuance and no ability to express any drainage.  Studiy review There no studies to review.  Assessment Recurrent perianal pain and discomfort with drainage and swelling in the previous fistulotomy site. It is possible that the patient has developed a recurrent anorectal fistula however there is no clinical evidence of that at this time.  Plan The patient and I want to wait to see she should develop recurrent symptoms and call me back for scheduling a repeat exam under anesthesia and fistulotomy. I am very sure that we opened up her fistulous tract the last time however she may have been secondary site that needs to be explored.  I will wait for the patient's phone call. 

## 2011-12-15 NOTE — Anesthesia Postprocedure Evaluation (Signed)
Anesthesia Post Note  Patient: [name]  Procedure(s) Performed: Procedure(s) (LRB): EXAM UNDER ANESTHESIA WITH ANAL FISTULECTOMY (N/A)  Anesthesia type: General  Patient location: PACU  Post pain: Pain level controlled and Adequate analgesia  Post assessment: Post-op Vital signs reviewed, Patient's Cardiovascular Status Stable, Respiratory Function Stable, Patent Airway and Pain level controlled  Last Vitals:  Filed Vitals:   12/15/11 1430  BP: 110/56  Pulse: 92  Temp: 36.4 C  Resp: 16    Post vital signs: Reviewed and stable  Level of consciousness: awake, alert  and oriented  Complications: No apparent anesthesia complications

## 2011-12-15 NOTE — Anesthesia Preprocedure Evaluation (Signed)
Anesthesia Evaluation  Patient identified by MRN, date of birth, ID band Patient awake    Reviewed: Allergy & Precautions, H&P , NPO status , Patient's Chart, lab work & pertinent test results  Airway Mallampati: II  Neck ROM: full    Dental   Pulmonary asthma ,          Cardiovascular hypertension,     Neuro/Psych    GI/Hepatic   Endo/Other    Renal/GU      Musculoskeletal   Abdominal   Peds  Hematology   Anesthesia Other Findings   Reproductive/Obstetrics                           Anesthesia Physical Anesthesia Plan  ASA: II  Anesthesia Plan: General   Post-op Pain Management:    Induction: Intravenous  Airway Management Planned: LMA  Additional Equipment:   Intra-op Plan:   Post-operative Plan:   Informed Consent: I have reviewed the patients History and Physical, chart, labs and discussed the procedure including the risks, benefits and alternatives for the proposed anesthesia with the patient or authorized representative who has indicated his/her understanding and acceptance.     Plan Discussed with: CRNA and Surgeon  Anesthesia Plan Comments:         Anesthesia Quick Evaluation  

## 2011-12-15 NOTE — Interval H&P Note (Signed)
History and Physical Interval Note:  12/15/2011 1:23 PM  [name]  has presented today for surgery, with the diagnosis of Recurrent anal fistula and pain  The various methods of treatment have been discussed with the patient and family. After consideration of risks, benefits and other options for treatment, the patient has consented to  Procedure(s) (LRB): EXAM UNDER ANESTHESIA WITH ANAL FISTULECTOMY (N/A) as a surgical intervention .  The patients' history has been reviewed, patient examined, no change in status, stable for surgery.  I have reviewed the patients' chart and labs.  Questions were answered to the patient's satisfaction.     Sagan Wurzel III,Jojuan Champney O  The patient called back and stated that the area was getting worse.  Will go ahead and schedule for EUA and repeat Fistulotomy.  Marta Lamas. Gae Bon, MD, FACS [phone] [facility] Surgery

## 2011-12-16 ENCOUNTER — Telehealth (INDEPENDENT_AMBULATORY_CARE_PROVIDER_SITE_OTHER): Payer: Self-pay

## 2011-12-16 LAB — POCT HEMOGLOBIN-HEMACUE: Hemoglobin: 13 g/dL (ref 12.0–15.0)

## 2011-12-16 NOTE — Telephone Encounter (Signed)
The sister called for the pt and states she wanted to know what was done during surgery and what was found.  They called yesterday but that message wasn't relayed properly.  I told her Dr Lindie Spruce is available tomorrow and we can ask him to call the pt to discuss with her.  Her # is [phone]

## 2011-12-17 ENCOUNTER — Telehealth (INDEPENDENT_AMBULATORY_CARE_PROVIDER_SITE_OTHER): Payer: Self-pay | Admitting: General Surgery

## 2011-12-18 ENCOUNTER — Telehealth (INDEPENDENT_AMBULATORY_CARE_PROVIDER_SITE_OTHER): Payer: Self-pay | Admitting: General Surgery

## 2011-12-18 NOTE — Telephone Encounter (Signed)
Pt calling in to ask about pain control at this point post op.  She had surgery 4 days ago and does not like the pain meds, because she has GI upset when taking them.  She is passing her BM slowly and fully, cleansing well and using the sitz bath.  She states that when she stands after a BM, the pain "is as bad as right after the surgery."  Recommended she use Aleve or ibuprofen for pain if she doesn't want to use the narcotic medication.  She states she will try that and will call back prn.

## 2011-12-25 ENCOUNTER — Encounter (INDEPENDENT_AMBULATORY_CARE_PROVIDER_SITE_OTHER): Payer: Self-pay

## 2011-12-25 ENCOUNTER — Ambulatory Visit (INDEPENDENT_AMBULATORY_CARE_PROVIDER_SITE_OTHER): Payer: BC Managed Care – PPO | Admitting: General Surgery

## 2011-12-25 VITALS — BP 138/84 | HR 71 | Temp 97.2°F | Resp 16 | Ht 64.0 in | Wt 218.8 lb

## 2011-12-25 DIAGNOSIS — Z09 Encounter for follow-up examination after completed treatment for conditions other than malignant neoplasm: Secondary | ICD-10-CM | POA: Insufficient documentation

## 2011-12-25 MED ORDER — HYDROCODONE-ACETAMINOPHEN 7.5-500 MG PO TABS: 1.0000 | ORAL_TABLET | Freq: Four times a day (QID) | ORAL | Status: DC | PRN

## 2011-12-25 NOTE — Progress Notes (Signed)
HPI The patient is status post examination under anesthesia and placement of a seton in a left-sided intersphincteric anal rectal fistula.  PE On examination today the patient is very tender. She has a moderate amount of mildly foul-smelling exudate at that area. I was able to tighten the seton with a hemostat clamp. This constipation have significant discomfort.  Digital examination demonstrated what appeared to be normal healing at the site of the seton and a fistulous tract. There was minimal bleeding.  Studiy review None.  Assessment Healing in the rectal fistula with seton in place.  Plan A need to see the patient back in 2 weeks.  Cause of her significant pain continuing I will order her more pain medication.

## 2011-12-30 ENCOUNTER — Telehealth (INDEPENDENT_AMBULATORY_CARE_PROVIDER_SITE_OTHER): Payer: Self-pay

## 2011-12-30 NOTE — Telephone Encounter (Signed)
Disability forms signed by Dr. Lindie Spruce, faxed to [facility] [phone] copy given to patient, copy to medical records to be scanned.

## 2012-01-06 ENCOUNTER — Encounter (INDEPENDENT_AMBULATORY_CARE_PROVIDER_SITE_OTHER): Payer: Self-pay | Admitting: General Surgery

## 2012-01-06 ENCOUNTER — Ambulatory Visit (INDEPENDENT_AMBULATORY_CARE_PROVIDER_SITE_OTHER): Payer: BC Managed Care – PPO | Admitting: General Surgery

## 2012-01-06 VITALS — BP 132/86 | HR 70 | Temp 96.8°F | Resp 14 | Ht 64.5 in | Wt 215.5 lb

## 2012-01-06 DIAGNOSIS — Z09 Encounter for follow-up examination after completed treatment for conditions other than malignant neoplasm: Secondary | ICD-10-CM

## 2012-01-06 NOTE — Progress Notes (Signed)
HPI The patient comes in today feeling well with minimal bleeding minimal discomfort in stating that the seton that will put in place no longer there.  PE On examination she has a completely well he'll perirectal area. The seton that was in place is no longer there. I cannot feel the fistulous tract she has minimal discomfort. It was no bleeding.  Studiy review None.  Assessment Complete healing of anorectal fistula status post seton placement.  Plan Return to see me if she should develop problems in the future.

## 2012-01-26 ENCOUNTER — Encounter (HOSPITAL_COMMUNITY): Payer: Self-pay

## 2012-01-26 ENCOUNTER — Emergency Department (HOSPITAL_COMMUNITY)
Admission: EM | Admit: 2012-01-26 | Discharge: 2012-01-26 | Disposition: A | Discharge status: Home / Self Care | Payer: BC Managed Care – PPO | Source: Home / Self Care | Attending: Family Medicine | Admitting: Family Medicine

## 2012-01-26 DIAGNOSIS — R51 Headache: Secondary | ICD-10-CM

## 2012-01-26 MED ORDER — ONDANSETRON HCL 4 MG/2ML IJ SOLN
4.0000 mg | Freq: Once | INTRAMUSCULAR | Status: AC
  Administered 2012-01-26: 4 mg via INTRAMUSCULAR

## 2012-01-26 MED ORDER — KETOROLAC TROMETHAMINE 60 MG/2ML IM SOLN
60.0000 mg | Freq: Once | INTRAMUSCULAR | Status: AC
  Administered 2012-01-26: 60 mg via INTRAMUSCULAR

## 2012-01-26 NOTE — Discharge Instructions (Signed)

## 2012-01-26 NOTE — ED Provider Notes (Signed)
History     CSN: [id number]  Arrival date & time 01/26/12  1556   First MD Initiated Contact with Patient 01/26/12 1721      Chief Complaint  Patient presents with  . Headache    (Consider location/radiation/quality/duration/timing/severity/associated sxs/prior treatment) Patient is a 41 y.o. female presenting with headaches. The history is provided by the patient. No language interpreter was used.  Headache The primary symptoms include headaches and nausea. The symptoms began yesterday. The episode lasted 12 hours. The symptoms are unchanged.  The headache is not associated with neck stiffness or weakness.  Additional symptoms include pain. Additional symptoms do not include neck stiffness or weakness. Associated symptoms comments: nausea. Medical issues also include seizures.    Past Medical History  Diagnosis Date  . Hypertension   . Anal fistula   . Asthma     controlled    Past Surgical History  Procedure Date  . Abdominal hysterectomy   . Anal fistulectomy 12/15/11  . Cesarean section     Family History  Problem Relation Age of Onset  . Heart disease Mother   . Cancer Father     colon  . Heart disease Maternal Grandmother     History  Substance Use Topics  . Smoking status: Never Smoker   . Smokeless tobacco: Not on file  . Alcohol Use: Yes     occasional    OB History    Grav Para Term Preterm Abortions TAB SAB Ect Mult Living                  Review of Systems  HENT: Negative for neck stiffness.   Gastrointestinal: Positive for nausea.  Neurological: Positive for headaches. Negative for weakness.  All other systems reviewed and are negative.    Allergies  Review of patient's allergies indicates no known allergies.  Home Medications   Current Outpatient Rx  Name Route Sig Dispense Refill  . CETIRIZINE HCL 10 MG PO TABS Oral Take 10 mg by mouth daily.    Marland Kitchen VALSARTAN 160 MG PO TABS Oral Take 160 mg by mouth daily.    . ALBUTEROL IN  Inhalation Inhale into the lungs as needed.    Marland Kitchen HYDROCODONE-ACETAMINOPHEN 7.5-500 MG PO TABS Oral Take 1 tablet by mouth every 6 (six) hours as needed for pain. 30 tablet 1  . VALSARTAN-HYDROCHLOROTHIAZIDE 160-12.5 MG PO TABS        BP 133/77  Pulse 84  Temp(Src) 98.8 F (37.1 C) (Oral)  Resp 18  SpO2 99%  Physical Exam  Nursing note and vitals reviewed. Constitutional: She is oriented to person, place, and time. She appears well-developed and well-nourished.  HENT:  Head: Normocephalic and atraumatic.  Right Ear: External ear normal.  Left Ear: External ear normal.  Nose: Nose normal.  Mouth/Throat: Oropharynx is clear and moist.  Eyes: Conjunctivae and EOM are normal. Pupils are equal, round, and reactive to light.  Neck: Normal range of motion. Neck supple.  Cardiovascular: Normal rate and normal heart sounds.   Pulmonary/Chest: Effort normal and breath sounds normal.  Abdominal: Soft.  Musculoskeletal: Normal range of motion.  Neurological: She is alert and oriented to person, place, and time. She has normal reflexes.  Skin: Skin is warm.  Psychiatric: She has a normal mood and affect.    ED Course  Procedures (including critical care time)  Labs Reviewed - No data to display No results found.   No diagnosis found.    MDM  Pt given  injection of torodol and zofran.   Pt advised to see Dr. Parke Simmers.   I doubt pathologic etiology to headache        Lonia Skinner [place], [place] 01/26/12 [phone]

## 2012-01-26 NOTE — ED Notes (Signed)
Reports HA since yesterday w n/v/light sensitivity; minmial relief w OTC mediaction

## 2012-01-28 NOTE — ED Provider Notes (Signed)
Medical screening examination/treatment/procedure(s) were performed by non-physician practitioner and as supervising physician I was immediately available for consultation/collaboration.   [facility]; MD   Sharin Grave, MD 01/28/12 0730

## 2012-03-15 ENCOUNTER — Emergency Department (INDEPENDENT_AMBULATORY_CARE_PROVIDER_SITE_OTHER)
Admission: EM | Admit: 2012-03-15 | Discharge: 2012-03-15 | Disposition: A | Discharge status: Home / Self Care | Payer: BC Managed Care – PPO | Source: Home / Self Care | Attending: Emergency Medicine | Admitting: Emergency Medicine

## 2012-03-15 ENCOUNTER — Encounter (HOSPITAL_COMMUNITY): Payer: Self-pay | Admitting: *Deleted

## 2012-03-15 DIAGNOSIS — H109 Unspecified conjunctivitis: Secondary | ICD-10-CM

## 2012-03-15 MED ORDER — TETRACAINE HCL 0.5 % OP SOLN
OPHTHALMIC | Status: AC
  Filled 2012-03-15: qty 2

## 2012-03-15 MED ORDER — ERYTHROMYCIN 5 MG/GM OP OINT: TOPICAL_OINTMENT | OPHTHALMIC | Status: AC

## 2012-03-15 NOTE — ED Notes (Signed)
Per pt fruit fly flew into her eye Friday am - eye red and irritated since occurred - burning discomfort - drainage

## 2012-03-15 NOTE — ED Provider Notes (Addendum)
Chief Complaint  Patient presents with  . Eye Problem    History of Present Illness:   Mrs. Willeen Cass has had a four-day history of right eye irritation. This began when a gnat flew into her eye. She was able to remove the gnat, but ever since then the eye has been red, irritated, mildly painful, and had a little bit of white discharge. Her vision is normal. She denies any irritation or pain in her left eye or URI symptoms or fever.  Review of Systems:  Other than noted above, the patient denies any of the following symptoms: Systemic:  No fever, chills, sweats, fatigue, or weight loss. Eye:  No redness, eye pain, photophobia, discharge, blurred vision, or diplopia. ENT:  No nasal congestion, rhinorrhea, or sore throat. Lymphatic:  No adenopathy. Skin:  No rash or pruritis.  PMFSH:  Past medical history, family history, social history, meds, and allergies were reviewed.  Physical Exam:   Vital signs:  BP 138/82  Pulse 83  Temp 98.6 F (37 C) (Oral)  Resp 16  SpO2 100% General:  Alert and in no distress. Eye:  Lids and periorbital tissues are normal. There was no foreign body in the conjunctival sac. The upper lid was everted and conjunctiva is mildly injected. There is no discharge. Cornea is intact to fluorescein staining, anterior chamber is normal. Funduscopic exam is normal with normal discs and vessels. PERRLA, full EOMs. ENT:  TMs and canals clear.  Nasal mucosa normal.  No intra-oral lesions, mucous membranes moist, pharynx clear. Neck:  No adenopathy tenderness or mass. Skin:  Clear, warm and dry.  Assessment:  The encounter diagnosis was Conjunctivitis. Appears that she got the gnat out of her eye, but it may have irritated the eye a little bit. And I'm prescribing her some erythromycin ophthalmic ointment and told to use warm compresses and tea bag poltices. Plan:   1.  The following meds were prescribed:   New Prescriptions   ERYTHROMYCIN OPHTHALMIC OINTMENT    Place a 1/2  inch ribbon of ointment into the lower eyelid QID.   2.  The patient was instructed in symptomatic care and handouts were given. 3.  The patient was told to return if becoming worse in any way, if no better in 3 or 4 days, and given some red flag symptoms that would indicate earlier return.     Reuben Likes, MD 03/15/12 2050  Reuben Likes, MD 03/20/12 1125

## 2013-06-01 ENCOUNTER — Other Ambulatory Visit: Payer: Self-pay | Admitting: Family Medicine

## 2013-06-01 ENCOUNTER — Other Ambulatory Visit (HOSPITAL_COMMUNITY)
Admission: RE | Admit: 2013-06-01 | Discharge: 2013-06-01 | Disposition: A | Discharge status: Home / Self Care | Payer: Self-pay | Source: Ambulatory Visit | Attending: Family Medicine | Admitting: Family Medicine

## 2013-06-01 DIAGNOSIS — Z01419 Encounter for gynecological examination (general) (routine) without abnormal findings: Secondary | ICD-10-CM | POA: Insufficient documentation

## 2014-09-28 ENCOUNTER — Other Ambulatory Visit: Payer: Self-pay | Admitting: Internal Medicine

## 2014-09-28 DIAGNOSIS — Z1231 Encounter for screening mammogram for malignant neoplasm of breast: Secondary | ICD-10-CM

## 2014-10-06 ENCOUNTER — Ambulatory Visit: Payer: Self-pay

## 2015-03-11 ENCOUNTER — Encounter (HOSPITAL_COMMUNITY): Payer: Self-pay | Admitting: *Deleted

## 2015-03-11 ENCOUNTER — Emergency Department (INDEPENDENT_AMBULATORY_CARE_PROVIDER_SITE_OTHER)
Admission: EM | Admit: 2015-03-11 | Discharge: 2015-03-11 | Disposition: A | Discharge status: Home / Self Care | Payer: 59 | Source: Home / Self Care | Attending: Family Medicine | Admitting: Family Medicine

## 2015-03-11 DIAGNOSIS — K2900 Acute gastritis without bleeding: Secondary | ICD-10-CM

## 2015-03-11 MED ORDER — GI COCKTAIL ~~LOC~~
ORAL | Status: AC
  Filled 2015-03-11: qty 30

## 2015-03-11 MED ORDER — RANITIDINE HCL 150 MG PO TABS: 150.0000 mg | ORAL_TABLET | Freq: Two times a day (BID) | ORAL | Status: DC

## 2015-03-11 MED ORDER — GI COCKTAIL ~~LOC~~
30.0000 mL | Freq: Once | ORAL | Status: AC
  Administered 2015-03-11: 30 mL via ORAL

## 2015-03-11 NOTE — Discharge Instructions (Signed)
Take medicine as prescribed, see specialist if further problems.

## 2015-03-11 NOTE — ED Notes (Signed)
Pt  Reports  Epigastric  Pain   Worse  After  Eating  With  Onset of symptoms        For 4-5  Days       Pt  denys  Any  Nausea  Or  Vomiting  However reports  Some  Diarrhea

## 2015-03-11 NOTE — ED Provider Notes (Signed)
CSN: [id number]     Arrival date & time 03/11/15  1421 History   First MD Initiated Contact with Patient 03/11/15 1547     Chief Complaint  Patient presents with  . Abdominal Pain   (Consider location/radiation/quality/duration/timing/severity/associated sxs/prior Treatment) Patient is a 44 y.o. female presenting with abdominal pain. The history is provided by the patient.  Abdominal Pain Pain location:  Epigastric Pain quality: burning   Pain radiates to:  Does not radiate Pain severity:  Moderate Onset quality:  Gradual Duration:  1 week Timing: onset 10 minutes after eating. Progression:  Worsening Chronicity:  New Context: eating   Context: not laxative use   Relieved by:  None tried Worsened by:  Nothing tried Ineffective treatments:  None tried Associated symptoms: belching   Associated symptoms: no chest pain, no constipation, no diarrhea, no fever, no hematemesis, no hematochezia, no hematuria, no nausea and no vomiting   Risk factors: no aspirin use and no NSAID use     Past Medical History  Diagnosis Date  . Hypertension   . Anal fistula   . Asthma     controlled   Past Surgical History  Procedure Laterality Date  . Abdominal hysterectomy    . Anal fistulectomy  12/15/11  . Cesarean section     Family History  Problem Relation Age of Onset  . Heart disease Mother   . Cancer Father     colon  . Heart disease Maternal Grandmother    History  Substance Use Topics  . Smoking status: Never Smoker   . Smokeless tobacco: Not on file  . Alcohol Use: Yes     Comment: occasional   OB History    No data available     Review of Systems  Constitutional: Negative.  Negative for fever.  Cardiovascular: Negative for chest pain and leg swelling.  Gastrointestinal: Positive for abdominal pain. Negative for nausea, vomiting, diarrhea, constipation, blood in stool, hematochezia, rectal pain and hematemesis.  Genitourinary: Negative for hematuria.    Allergies   Review of patient's allergies indicates no known allergies.  Home Medications   Prior to Admission medications   Medication Sig Start Date End Date Taking? Authorizing Provider  ALBUTEROL IN Inhale into the lungs as needed.    Historical Provider, MD  cetirizine (ZYRTEC) 10 MG tablet Take 10 mg by mouth daily.    Historical Provider, MD  HYDROcodone-acetaminophen (LORTAB) 7.5-500 MG per tablet Take 1 tablet by mouth every 6 (six) hours as needed for pain. 12/25/11   Jimmye Norman, MD  ranitidine (ZANTAC) 150 MG tablet Take 1 tablet (150 mg total) by mouth 2 (two) times daily. 03/11/15   Linna Hoff, MD  Tetrahydrozoline HCl (VISINE OP) Apply to eye.    Historical Provider, MD  valsartan (DIOVAN) 160 MG tablet Take 160 mg by mouth daily.    Historical Provider, MD  valsartan-hydrochlorothiazide (DIOVAN-HCT) 160-12.5 MG per tablet  12/02/11   Historical Provider, MD   BP 130/85 mmHg  Pulse 81  Temp(Src) 98.2 F (36.8 C) (Oral)  Resp 16  SpO2 97% Physical Exam  Constitutional: She is oriented to person, place, and time. She appears well-developed and well-nourished. No distress.  Abdominal: Soft. Normal appearance and bowel sounds are normal. She exhibits no distension and no mass. There is tenderness in the epigastric area. There is no rebound, no guarding and no CVA tenderness.  Neurological: She is alert and oriented to person, place, and time.  Skin: Skin is warm and dry.  Nursing note and vitals reviewed.   ED Course  Procedures (including critical care time) Labs Review Labs Reviewed - No data to display  Imaging Review No results found.   MDM   1. Acute gastritis without hemorrhage        Linna Hoff, MD 03/11/15 [phone]

## 2015-07-09 ENCOUNTER — Emergency Department (INDEPENDENT_AMBULATORY_CARE_PROVIDER_SITE_OTHER)
Admission: EM | Admit: 2015-07-09 | Discharge: 2015-07-09 | Disposition: A | Discharge status: Home / Self Care | Payer: 59 | Source: Home / Self Care | Attending: Family Medicine | Admitting: Family Medicine

## 2015-07-09 ENCOUNTER — Encounter (HOSPITAL_COMMUNITY): Payer: Self-pay | Admitting: *Deleted

## 2015-07-09 DIAGNOSIS — M545 Low back pain, unspecified: Secondary | ICD-10-CM

## 2015-07-09 LAB — POCT URINALYSIS DIP (DEVICE)
Bilirubin Urine: NEGATIVE
Glucose, UA: NEGATIVE mg/dL
Ketones, ur: NEGATIVE mg/dL
Leukocytes, UA: NEGATIVE
NITRITE: NEGATIVE
PH: 5.5 (ref 5.0–8.0)
Protein, ur: NEGATIVE mg/dL
Specific Gravity, Urine: 1.025 (ref 1.005–1.030)
UROBILINOGEN UA: 0.2 mg/dL (ref 0.0–1.0)

## 2015-07-09 MED ORDER — HYDROCODONE-ACETAMINOPHEN 5-325 MG PO TABS: 1.0000 | ORAL_TABLET | ORAL | Status: DC | PRN

## 2015-07-09 NOTE — ED Notes (Signed)
Pt  Reports      Low    Back pain        X  2  Days         No  Injury      feels   Like  Some  Discomfort  As  Well  When   She  Urinates

## 2015-07-09 NOTE — Discharge Instructions (Signed)
Drink plenty of water tonight. Use pain medicine as needed, use heat, call urologist in am for possible kidney stone check.

## 2015-07-09 NOTE — ED Provider Notes (Signed)
CSN: [id number]     Arrival date & time 07/09/15  1602 History   First MD Initiated Contact with Patient 07/09/15 1735     Chief Complaint  Patient presents with  . Back Pain   (Consider location/radiation/quality/duration/timing/severity/associated sxs/prior Treatment) Patient is a 44 y.o. female presenting with back pain. The history is provided by the patient.  Back Pain Location:  Lumbar spine Quality:  Stabbing and cramping Radiates to:  Does not radiate Pain severity:  Mild Onset quality:  Sudden Duration:  2 days Timing:  Constant Chronicity:  New Relieved by:  None tried Worsened by:  Nothing tried Ineffective treatments:  None tried Associated symptoms: no abdominal pain, no bladder incontinence, no bowel incontinence, no dysuria, no fever, no leg pain, no numbness, no pelvic pain, no perianal numbness and no weakness     Past Medical History  Diagnosis Date  . Hypertension   . Anal fistula   . Asthma     controlled   Past Surgical History  Procedure Laterality Date  . Abdominal hysterectomy    . Anal fistulectomy  12/15/11  . Cesarean section     Family History  Problem Relation Age of Onset  . Heart disease Mother   . Cancer Father     colon  . Heart disease Maternal Grandmother    Social History  Substance Use Topics  . Smoking status: Never Smoker   . Smokeless tobacco: None  . Alcohol Use: Yes     Comment: occasional   OB History    No data available     Review of Systems  Constitutional: Negative.  Negative for fever.  Cardiovascular: Negative.   Gastrointestinal: Negative.  Negative for abdominal pain and bowel incontinence.  Genitourinary: Positive for urgency and flank pain. Negative for bladder incontinence, dysuria, frequency, hematuria and pelvic pain.  Musculoskeletal: Positive for back pain. Negative for gait problem.  Skin: Negative.   Neurological: Negative for weakness and numbness.  All other systems reviewed and are  negative.   Allergies  Review of patient's allergies indicates no known allergies.  Home Medications   Prior to Admission medications   Medication Sig Start Date End Date Taking? Authorizing Provider  ALBUTEROL IN Inhale into the lungs as needed.    Historical Provider, MD  cetirizine (ZYRTEC) 10 MG tablet Take 10 mg by mouth daily.    Historical Provider, MD  HYDROcodone-acetaminophen (NORCO/VICODIN) 5-325 MG tablet Take 1 tablet by mouth every 4 (four) hours as needed. For pain 07/09/15   Linna Hoff, MD  ranitidine (ZANTAC) 150 MG tablet Take 1 tablet (150 mg total) by mouth 2 (two) times daily. 03/11/15   Linna Hoff, MD  Tetrahydrozoline HCl (VISINE OP) Apply to eye.    Historical Provider, MD  valsartan (DIOVAN) 160 MG tablet Take 160 mg by mouth daily.    Historical Provider, MD  valsartan-hydrochlorothiazide (DIOVAN-HCT) 160-12.5 MG per tablet  12/02/11   Historical Provider, MD   Meds Ordered and Administered this Visit  Medications - No data to display  BP 168/95 mmHg  Pulse 92  Temp(Src) 97.5 F (36.4 C) (Oral)  Resp 20  SpO2 99% No data found.   Physical Exam  Constitutional: She is oriented to person, place, and time. She appears well-developed and well-nourished.  Abdominal: Soft. Bowel sounds are normal. She exhibits no mass. There is no tenderness.  Musculoskeletal: She exhibits tenderness.  Neurological: She is alert and oriented to person, place, and time.  Skin: Skin is  warm and dry.  Nursing note and vitals reviewed.   ED Course  Procedures (including critical care time)  Labs Review Labs Reviewed  POCT URINALYSIS DIP (DEVICE) - Abnormal; Notable for the following:    Hgb urine dipstick TRACE (*)    All other components within normal limits    Imaging Review No results found.   Visual Acuity Review  Right Eye Distance:   Left Eye Distance:   Bilateral Distance:    Right Eye Near:   Left Eye Near:    Bilateral Near:         MDM    1. Back pain at L4-L5 level        Linna HoffJames D Kimala Horne, MD 07/09/15 [phone]

## 2016-04-15 ENCOUNTER — Encounter (HOSPITAL_COMMUNITY): Payer: Self-pay

## 2016-04-15 ENCOUNTER — Emergency Department (HOSPITAL_COMMUNITY)
Admission: EM | Admit: 2016-04-15 | Discharge: 2016-04-15 | Disposition: A | Discharge status: Home / Self Care | Payer: 59 | Attending: Emergency Medicine | Admitting: Emergency Medicine

## 2016-04-15 DIAGNOSIS — M79604 Pain in right leg: Secondary | ICD-10-CM | POA: Diagnosis not present

## 2016-04-15 DIAGNOSIS — Z79899 Other long term (current) drug therapy: Secondary | ICD-10-CM | POA: Diagnosis not present

## 2016-04-15 DIAGNOSIS — J45909 Unspecified asthma, uncomplicated: Secondary | ICD-10-CM | POA: Diagnosis not present

## 2016-04-15 DIAGNOSIS — I10 Essential (primary) hypertension: Secondary | ICD-10-CM | POA: Insufficient documentation

## 2016-04-15 DIAGNOSIS — M7989 Other specified soft tissue disorders: Secondary | ICD-10-CM | POA: Diagnosis present

## 2016-04-15 MED ORDER — IBUPROFEN 800 MG PO TABS
800.0000 mg | ORAL_TABLET | Freq: Once | ORAL | Status: AC
  Administered 2016-04-15: 800 mg via ORAL
  Filled 2016-04-15: qty 1

## 2016-04-15 NOTE — ED Provider Notes (Signed)
[facility]-EMERGENCY DEPT Provider Note   CSN: [id number] Arrival date & time: 04/15/16  2128  By signing my name below, I, Majel Homer, attest that this documentation has been prepared under the direction and in the presence of Traeger Sultana, MD . Electronically Signed: Majel Homer, Scribe. 04/15/2016. 11:12 PM.  History   Chief Complaint Chief Complaint  Patient presents with  . Leg Swelling    Right Leg   The history is provided by the patient. No language interpreter was used.  Leg Pain   This is a new problem. The current episode started more than 2 days ago. The problem occurs constantly. The problem has not changed since onset.The pain is present in the right lower leg and right foot. The quality of the pain is described as aching. The pain is moderate. Pertinent negatives include no numbness and no itching. She has tried nothing for the symptoms. The treatment provided no relief. There has been no history of extremity trauma. Family history is significant for no gout.   HPI Comments: [name] is a 45 y.o. female who presents to the Emergency Department complaining of gradually worsening, "throbbing," right leg swelling that began 4 days ago and worsened today. Pt reports she woke up 4 days with swelling her right foot that radiated upwards; she notes associated leg cramping that began last night. She states she has been wearing closed toed shoes and denies wearing flip flops.   Past Medical History:  Diagnosis Date  . Anal fistula   . Asthma    controlled  . Hypertension     Patient Active Problem List   Diagnosis Date Noted  . Postop check 01/06/2012  . 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 ABDOMINAL HYSTERECTOMY    . ANAL FISTULECTOMY  12/15/11  . CESAREAN SECTION      OB History    No data available       Home Medications    Prior to Admission medications   Medication Sig Start Date End Date Taking?  Authorizing Provider  ALBUTEROL IN Inhale into the lungs as needed.    Historical Provider, MD  cetirizine (ZYRTEC) 10 MG tablet Take 10 mg by mouth daily.    Historical Provider, MD  HYDROcodone-acetaminophen (NORCO/VICODIN) 5-325 MG tablet Take 1 tablet by mouth every 4 (four) hours as needed. For pain 07/09/15   Linna Hoff, MD  ranitidine (ZANTAC) 150 MG tablet Take 1 tablet (150 mg total) by mouth 2 (two) times daily. 03/11/15   Linna Hoff, MD  Tetrahydrozoline HCl (VISINE OP) Apply to eye.    Historical Provider, MD  valsartan (DIOVAN) 160 MG tablet Take 160 mg by mouth daily.    Historical Provider, MD  valsartan-hydrochlorothiazide (DIOVAN-HCT) 160-12.5 MG per tablet  12/02/11   Historical Provider, MD    Family History Family History  Problem Relation Age of Onset  . Heart disease Mother   . Cancer Father     colon  . Heart disease Maternal Grandmother     Social History Social History  Substance Use Topics  . Smoking status: Never Smoker  . Smokeless tobacco: Not on file  . Alcohol use Yes     Comment: occasional     Allergies   Review of patient's allergies indicates no known allergies.  Review of Systems Review of Systems  Cardiovascular: Positive for leg swelling. Negative for chest pain and palpitations.  Musculoskeletal: Negative for joint swelling.  Skin: Negative for itching.  Neurological: Negative for numbness.  All other systems reviewed and are negative.  Physical Exam Updated Vital Signs BP 148/77 (BP Location: Left Arm)   Pulse 71   Temp 98 F (36.7 C) (Oral)   Resp 16   Ht 5\' 4"  (1.626 m)   Wt 226 lb (102.5 kg)   SpO2 99%   BMI 38.79 kg/m   Physical Exam  Constitutional: She is oriented to person, place, and time. She appears well-developed and well-nourished. No distress.  HENT:  Head: Normocephalic and atraumatic.  Mouth/Throat: Oropharynx is clear and moist. No oropharyngeal exudate.  Moist mucous membranes   Eyes: Conjunctivae  are normal. Pupils are equal, round, and reactive to light.  Neck: Normal range of motion. Neck supple. No JVD present.  Trachea midline No bruit  Cardiovascular: Normal rate, regular rhythm, normal heart sounds and intact distal pulses.   Pulmonary/Chest: Effort normal and breath sounds normal. No stridor. No respiratory distress.  Abdominal: Soft. Bowel sounds are normal. She exhibits no distension.  Musculoskeletal: Normal range of motion.       Right knee: Normal.       Right ankle: Normal.       Right lower leg: Normal. She exhibits no tenderness, no bony tenderness, no swelling, no edema, no deformity and no laceration.       Right foot: Normal. There is normal range of motion, no tenderness, no bony tenderness, no swelling, normal capillary refill, no crepitus, no deformity and no laceration.  3+ PD pulse Cap refill <3 seconds No bruisng No deformity  All compartments soft  Temp similar, no cords   Neurological: She is alert and oriented to person, place, and time. She has normal reflexes.  Skin: Skin is warm and dry. Capillary refill takes less than 2 seconds.  Psychiatric: She has a normal mood and affect. Her behavior is normal.  Nursing note and vitals reviewed.  ED Treatments / Results  Labs (all labs ordered are listed, but only abnormal results are displayed) Labs Reviewed - No data to display  EKG  EKG Interpretation None       Radiology No results found.  Procedures Procedures  DIAGNOSTIC STUDIES:  Oxygen Saturation is 99% on RA, normal by my interpretation.    COORDINATION OF CARE:  11:08 PM Discussed treatment plan with pt at bedside and pt agreed to plan.  Medications Ordered in ED Medications - No data to display  Initial Impression / Assessment and Plan / ED Course  I have reviewed the triage vital signs and the nursing notes.  Pertinent labs & imaging results that were available during my care of the patient were reviewed by me and  considered in my medical decision making (see chart for details).  Clinical Course    Vitals:   04/15/16 2135  BP: 148/77  Pulse: 71  Resp: 16  Temp: 98 F (36.7 C)   Medications  ibuprofen (ADVIL,MOTRIN) tablet 800 mg (not administered)     I personally performed the services described in this documentation, which was scribed in my presence. The recorded information has been reviewed and is accurate.   Final Clinical Impressions(s) / ED Diagnoses   Final diagnoses:  None   Will set up for outpatient doppler for DVT.  This is unlikely.  Follow up with your PMD for ongoing care New Prescriptions New Prescriptions   No medications on file  All questions answered to patient's satisfaction. Based on history and exam patient  has been appropriately medically screened and emergency conditions excluded. Patient is stable for discharge at this time. Follow up with your PMD for recheck in 2 days and strict return precautions given   Providence Stivers, MD 04/15/16 2319

## 2016-04-15 NOTE — ED Triage Notes (Addendum)
Pt c/o right leg swelling and pain since Saturday. Pt denies hx of leg swelling. Pt denies hx of DVTs. Pt denies hx of SOB. Pt recent hx of injury/trauma to right leg. Pt ambulatory to triage. Pt A+OX4, speaking in complete sentences.

## 2016-04-16 ENCOUNTER — Ambulatory Visit (HOSPITAL_COMMUNITY): Admission: RE | Admit: 2016-04-16 | Payer: 59 | Source: Ambulatory Visit

## 2016-04-17 ENCOUNTER — Ambulatory Visit (HOSPITAL_COMMUNITY)
Admission: RE | Admit: 2016-04-17 | Discharge: 2016-04-17 | Disposition: A | Discharge status: Home / Self Care | Payer: 59 | Source: Ambulatory Visit | Attending: Emergency Medicine | Admitting: Emergency Medicine

## 2016-04-17 DIAGNOSIS — M7989 Other specified soft tissue disorders: Secondary | ICD-10-CM | POA: Diagnosis not present

## 2016-04-17 DIAGNOSIS — M79604 Pain in right leg: Secondary | ICD-10-CM | POA: Diagnosis not present

## 2016-04-17 NOTE — Progress Notes (Signed)
*  PRELIMINARY RESULTS* Vascular Ultrasound Right lower extremity venous duplex has been completed.  Preliminary findings: No evidence of DVT or baker's cyst.  Farrel DemarkJill Eunice, RDMS, RVT  04/17/2016, 9:35 AM

## 2017-09-22 ENCOUNTER — Encounter: Payer: Self-pay | Admitting: Internal Medicine

## 2017-11-04 ENCOUNTER — Ambulatory Visit (INDEPENDENT_AMBULATORY_CARE_PROVIDER_SITE_OTHER): Payer: 59 | Admitting: Internal Medicine

## 2017-11-04 ENCOUNTER — Encounter: Payer: Self-pay | Admitting: Internal Medicine

## 2017-11-04 VITALS — BP 132/90 | HR 84 | Ht 63.5 in | Wt 231.0 lb

## 2017-11-04 DIAGNOSIS — K603 Anal fistula: Secondary | ICD-10-CM | POA: Diagnosis not present

## 2017-11-04 DIAGNOSIS — Z800 Family history of malignant neoplasm of digestive organs: Secondary | ICD-10-CM | POA: Diagnosis not present

## 2017-11-04 DIAGNOSIS — Z1211 Encounter for screening for malignant neoplasm of colon: Secondary | ICD-10-CM | POA: Diagnosis not present

## 2017-11-04 MED ORDER — PEG-KCL-NACL-NASULF-NA ASC-C 140 G PO SOLR: 1.0000 | Freq: Once | ORAL | 0 refills | Status: AC

## 2017-11-04 NOTE — Patient Instructions (Signed)

## 2017-11-04 NOTE — Progress Notes (Signed)
HISTORY OF PRESENT ILLNESS:  [name] Arnold Arnold is a 47 y.o. female , [employer], physiologicalcafeteria cashier, who sent by her primary care provider Monique PandaKimberly Millsaps NP with a chief complaint of high-risk colon cancer screening. The patient is accompanied by her wife. Her gastrointestinal history is remarkable for anal fistulotomy and seton placement in 2013 for recurrent anal fistula with pain. Dr. Lindie SpruceWyatt. No additional GI problems. Her GI review of systems is entirely negative. Her father died from colon cancer in his early 7960s. The patient'Arnold siblings have been checked. No cancer. Review of outside blood work from December 2018 is unremarkable including comprehensive metabolic panel and CBC. No relevant radiology. Patient has not had previous colonoscopy.  REVIEW OF SYSTEMS:  All non-GI ROS negative unless otherwise stated in the history of present illness except for back pain, itching, urinary leakage, ankle swelling, night sweats  Past Medical History:  Diagnosis Date  . Anal fistula   . Asthma    controlled  . Hypertension     Past Surgical History:  Procedure Laterality Date  . ABDOMINAL HYSTERECTOMY    . ANAL FISTULECTOMY  12/15/11  . CESAREAN SECTION      Social History [name] Arnold Arnold  reports that  has never smoked. she has never used smokeless tobacco. She reports that she does not drink alcohol or use drugs.  family history includes Colon cancer in her father; Diabetes in her mother; Heart disease in her maternal grandmother and mother; Other in her mother.  No Known Allergies     PHYSICAL EXAMINATION: Vital signs: BP 132/90 (BP Location: Left Arm, Patient Position: Sitting, Cuff Size: Normal)   Pulse 84   Ht 5' 3.5" (1.613 m) Comment: height measured without shoes  Wt 231 lb (104.8 kg)   BMI 40.28 kg/m   Constitutional: pleasant,generally well-appearing, no acute distress Psychiatric: alert and oriented x3, cooperative Eyes: extraocular movements  intact, anicteric, conjunctiva pink Mouth: oral pharynx moist, no lesions Neck: supple no lymphadenopathy Cardiovascular: heart regular rate and rhythm, no murmur Lungs: clear to auscultation bilaterally Abdomen: soft,obese, nontender, nondistended, no obvious ascites, no peritoneal signs, normal bowel sounds, no organomegaly Rectal:deferred until colonoscopy Extremities: no clubbing cyanosis or lower extremity edema bilaterally Skin: no lesions on visible extremities Neuro: No focal deficits. Cranial nerves intact  ASSESSMENT:  #1. Family history of colon cancer in her father likely less than age 49 by history #2. History of recurrent anal fistula managed surgically in 2013. No recurrence   PLAN:  #1. Screening colonoscopy.The nature of the procedure, as well as the risks, benefits, and alternatives were carefully and thoroughly reviewed with the patient. Ample time for discussion and questions allowed. The patient understood, was satisfied, and agreed to proceed.  A copy of this consultation note has been sent to Ms. Arnold

## 2017-11-20 ENCOUNTER — Other Ambulatory Visit: Payer: Self-pay

## 2017-11-20 ENCOUNTER — Encounter: Payer: Self-pay | Admitting: Internal Medicine

## 2017-11-20 ENCOUNTER — Ambulatory Visit (AMBULATORY_SURGERY_CENTER): Payer: 59 | Admitting: Internal Medicine

## 2017-11-20 VITALS — BP 125/79 | HR 79 | Temp 98.4°F | Resp 12 | Ht 63.0 in | Wt 231.0 lb

## 2017-11-20 DIAGNOSIS — Z1211 Encounter for screening for malignant neoplasm of colon: Secondary | ICD-10-CM | POA: Diagnosis not present

## 2017-11-20 DIAGNOSIS — Z800 Family history of malignant neoplasm of digestive organs: Secondary | ICD-10-CM

## 2017-11-20 MED ORDER — SODIUM CHLORIDE 0.9 % IV SOLN: 500.0000 mL | Freq: Once | INTRAVENOUS | Status: AC

## 2017-11-20 NOTE — Patient Instructions (Signed)
**   Handout given on diverticulosis **   YOU HAD AN ENDOSCOPIC PROCEDURE TODAY AT THE [facility]:   Refer to the procedure report that was given to you for any specific questions about what was found during the examination.  If the procedure report does not answer your questions, please call your gastroenterologist to clarify.  If you requested that your care partner not be given the details of your procedure findings, then the procedure report has been included in a sealed envelope for you to review at your convenience later.  YOU SHOULD EXPECT: Some feelings of bloating in the abdomen. Passage of more gas than usual.  Walking can help get rid of the air that was put into your GI tract during the procedure and reduce the bloating. If you had a lower endoscopy (such as a colonoscopy or flexible sigmoidoscopy) you may notice spotting of blood in your stool or on the toilet paper. If you underwent a bowel prep for your procedure, you may not have a normal bowel movement for a few days.  Please Note:  You might notice some irritation and congestion in your nose or some drainage.  This is from the oxygen used during your procedure.  There is no need for concern and it should clear up in a day or so.  SYMPTOMS TO REPORT IMMEDIATELY:   Following lower endoscopy (colonoscopy or flexible sigmoidoscopy):  Excessive amounts of blood in the stool  Significant tenderness or worsening of abdominal pains  Swelling of the abdomen that is new, acute  Fever of 100F or higher  For urgent or emergent issues, a gastroenterologist can be reached at any hour by calling [phone].   DIET:  We do recommend a small meal at first, but then you may proceed to your regular diet.  Drink plenty of fluids but you should avoid alcoholic beverages for 24 hours.  ACTIVITY:  You should plan to take it easy for the rest of today and you should NOT DRIVE or use heavy machinery until tomorrow (because of the  sedation medicines used during the test).    FOLLOW UP: Our staff will call the number listed on your records the next business day following your procedure to check on you and address any questions or concerns that you may have regarding the information given to you following your procedure. If we do not reach you, we will leave a message.  However, if you are feeling well and you are not experiencing any problems, there is no need to return our call.  We will assume that you have returned to your regular daily activities without incident.  If any biopsies were taken you will be contacted by phone or by letter within the next 1-3 weeks.  Please call us at [phone] if you have not heard about the biopsies in 3 weeks.    SIGNATURES/CONFIDENTIALITY: You and/or your care partner have signed paperwork which will be entered into your electronic medical record.  These signatures attest to the fact that that the information above on your After Visit Summary has been reviewed and is understood.  Full responsibility of the confidentiality of this discharge information lies with you and/or your care-partner. 

## 2017-11-20 NOTE — Progress Notes (Signed)
To recovery, report to RN, VSS. 

## 2017-11-20 NOTE — Op Note (Signed)
[facility] Patient Name: Lexani Corona Procedure Date: 11/20/2017 9:55 AM MRN: [id number] Endoscopist: Wilhemina Bonito. Marina Goodell , MD Age: 47 Referring MD:  Date of Birth: 1970-08-31 Gender: Female Account #: [id number] Procedure:                Colonoscopy Indications:              Screening in patient at increased risk: Colorectal                            cancer in father before age 85 Medicines:                Monitored Anesthesia Care Procedure:                Pre-Anesthesia Assessment:                           - Prior to the procedure, a History and Physical                            was performed, and patient medications and                            allergies were reviewed. The patient's tolerance of                            previous anesthesia was also reviewed. The risks                            and benefits of the procedure and the sedation                            options and risks were discussed with the patient.                            All questions were answered, and informed consent                            was obtained. Prior Anticoagulants: The patient has                            taken no previous anticoagulant or antiplatelet                            agents. ASA Grade Assessment: II - A patient with                            mild systemic disease. After reviewing the risks                            and benefits, the patient was deemed in                            satisfactory condition to undergo the procedure.  After obtaining informed consent, the colonoscope                            was passed under direct vision. Throughout the                            procedure, the patient's blood pressure, pulse, and                            oxygen saturations were monitored continuously. The                            Colonoscope was introduced through the anus and                            advanced to the the cecum,  identified by                            appendiceal orifice and ileocecal valve. The                            ileocecal valve, appendiceal orifice, and rectum                            were photographed. The quality of the bowel                            preparation was excellent. The colonoscopy was                            performed without difficulty. The patient tolerated                            the procedure well. The bowel preparation used was                            SUPREP. Scope In: 10:01:42 AM Scope Out: 10:10:35 AM Scope Withdrawal Time: 0 hours 6 minutes 51 seconds  Total Procedure Duration: 0 hours 8 minutes 53 seconds  Findings:                 Multiple small and large-mouthed diverticula were                            found in the entire colon.                           Internal hemorrhoids were found during retroflexion.                           The exam was otherwise without abnormality on                            direct and retroflexion views. Complications:            No immediate complications. Estimated  blood loss:                            None. Estimated Blood Loss:     Estimated blood loss: none. Impression:               - Diverticulosis in the entire examined colon.                           - Internal hemorrhoids.                           - The examination was otherwise normal on direct                            and retroflexion views.                           - No specimens collected. Recommendation:           - Repeat colonoscopy in 5 years for screening                            purposes.                           - Patient has a contact number available for                            emergencies. The signs and symptoms of potential                            delayed complications were discussed with the                            patient. Return to normal activities tomorrow.                            Written discharge instructions  were provided to the                            patient.                           - Resume previous diet.                           - Continue present medications. Wilhemina Bonito. Marina Goodell, MD 11/20/2017 10:14:46 AM This report has been signed electronically.

## 2017-11-23 ENCOUNTER — Telehealth: Payer: Self-pay

## 2017-11-23 NOTE — Telephone Encounter (Signed)
  Follow up Call-  Call back number 11/20/2017  Post procedure Call Back phone  # [phone]  Permission to leave phone message Yes  Some recent data might be hidden     Patient questions:  Do you have a fever, pain , or abdominal swelling? No. Pain Score  0 *  Have you tolerated food without any problems? Yes.    Have you been able to return to your normal activities? Yes.    Do you have any questions about your discharge instructions: Diet   No. Medications  No. Follow up visit  No.  Do you have questions or concerns about your Care? No.  Actions: * If pain score is 4 or above: No action needed, pain <4.

## 2017-12-17 ENCOUNTER — Encounter: Payer: 59 | Admitting: Internal Medicine

## 2019-09-30 ENCOUNTER — Other Ambulatory Visit: Payer: Self-pay | Admitting: Sports Medicine

## 2019-09-30 ENCOUNTER — Other Ambulatory Visit: Payer: Self-pay | Admitting: Physical Medicine and Rehabilitation

## 2019-09-30 DIAGNOSIS — M5416 Radiculopathy, lumbar region: Secondary | ICD-10-CM

## 2019-10-06 ENCOUNTER — Inpatient Hospital Stay: Admission: RE | Admit: 2019-10-06 | Payer: 59 | Source: Ambulatory Visit

## 2019-10-07 ENCOUNTER — Other Ambulatory Visit: Payer: 59

## 2019-10-14 ENCOUNTER — Other Ambulatory Visit: Payer: Self-pay

## 2019-10-14 ENCOUNTER — Ambulatory Visit
Admission: RE | Admit: 2019-10-14 | Discharge: 2019-10-14 | Disposition: A | Discharge status: Home / Self Care | Payer: 59 | Source: Ambulatory Visit | Attending: Sports Medicine | Admitting: Sports Medicine

## 2019-10-14 DIAGNOSIS — M5416 Radiculopathy, lumbar region: Secondary | ICD-10-CM

## 2019-10-14 MED ORDER — IOPAMIDOL (ISOVUE-M 200) INJECTION 41%
1.0000 mL | Freq: Once | INTRAMUSCULAR | Status: AC
  Administered 2019-10-14: 14:00:00 1 mL via EPIDURAL

## 2019-10-14 MED ORDER — METHYLPREDNISOLONE ACETATE 40 MG/ML INJ SUSP (RADIOLOG
120.0000 mg | Freq: Once | INTRAMUSCULAR | Status: AC
  Administered 2019-10-14: 14:00:00 120 mg via EPIDURAL

## 2019-10-14 NOTE — Discharge Instructions (Signed)

## 2020-07-31 IMAGING — XA Imaging study
2 series · 2 of 2 positions shown · non-contrast
Comparison: none

CLINICAL DATA: Lumbosacral spondylosis without myelopathy.
Bilateral low back pain and left groin pain.

[Series 1: ortho adipose · 1 of 1 slices shown (1 of 2)]
[im 1/1]
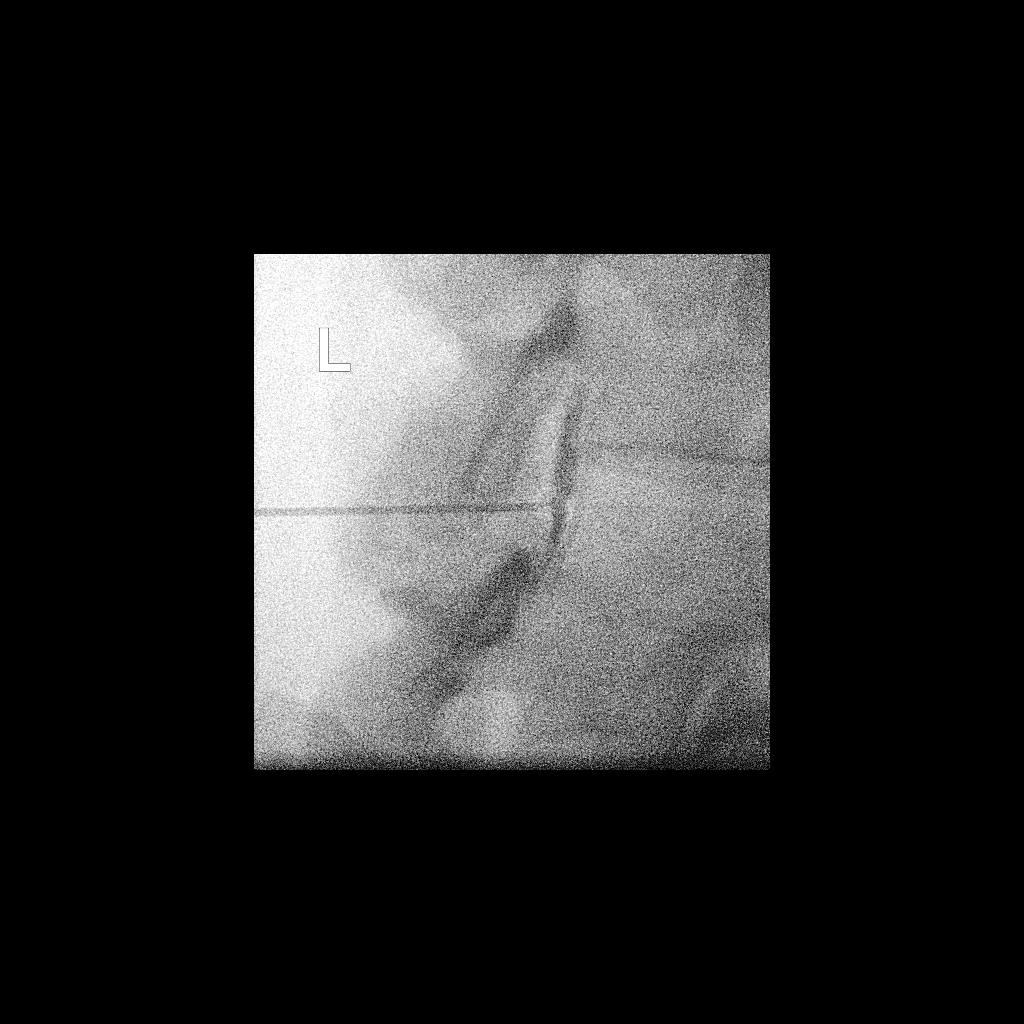

[Series 2: ortho adipose · 1 of 1 slices shown (2 of 2)]
[im 1/1]
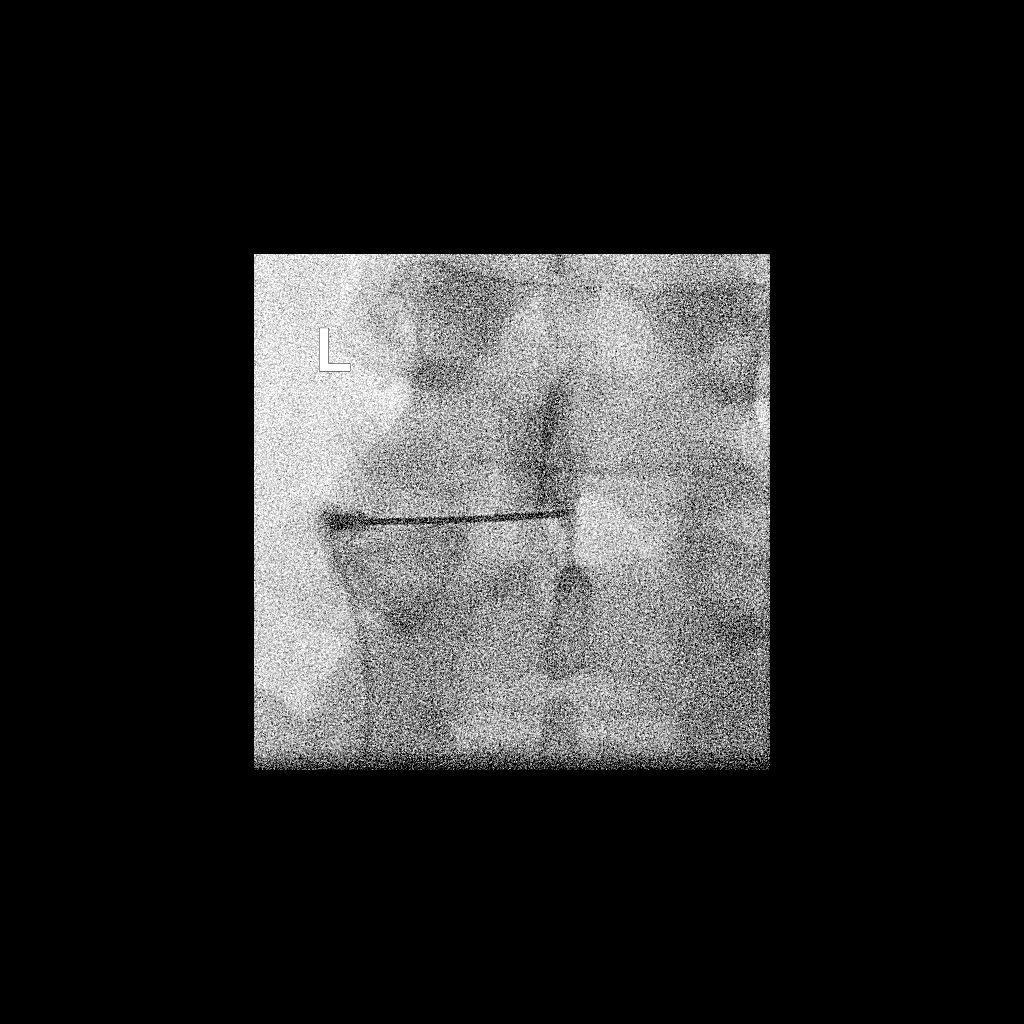

[2 of 2 positions shown; findings below may reference images not displayed]

FLUOROSCOPY TIME:  Fluoroscopy Time: 12 seconds

Radiation Exposure Index: 12.15 microGray*m^2

PROCEDURE:
The procedure, risks, benefits, and alternatives were explained to
the patient. Questions regarding the procedure were encouraged and
answered. The patient understands and consents to the procedure.

LUMBAR EPIDURAL INJECTION:

An interlaminar approach was performed on the left at L4-5. The
overlying skin was cleansed and anesthetized. A 3.5 inch 20 gauge
epidural needle was advanced using loss-of-resistance technique.

DIAGNOSTIC EPIDURAL INJECTION:

Injection of Isovue-M 200 shows a good epidural pattern with spread
above and below the level of needle placement, primarily on the
left. No vascular opacification is seen.

THERAPEUTIC EPIDURAL INJECTION:

120 mg of Depo-Medrol mixed with 3 mL of 1% lidocaine were
instilled. The procedure was well-tolerated, and the patient was
discharged thirty minutes following the injection in good condition.

COMPLICATIONS:
None
IMPRESSION: Technically successful interlaminar epidural injection on the left
at L4-5.

## 2021-10-11 ENCOUNTER — Encounter: Payer: Self-pay | Admitting: Internal Medicine

## 2021-11-12 ENCOUNTER — Ambulatory Visit (INDEPENDENT_AMBULATORY_CARE_PROVIDER_SITE_OTHER): Payer: 59 | Admitting: Internal Medicine

## 2021-11-12 ENCOUNTER — Encounter: Payer: Self-pay | Admitting: Internal Medicine

## 2021-11-12 VITALS — BP 140/88 | HR 70 | Ht 63.0 in | Wt 231.2 lb

## 2021-11-12 DIAGNOSIS — Z800 Family history of malignant neoplasm of digestive organs: Secondary | ICD-10-CM

## 2021-11-12 DIAGNOSIS — K6289 Other specified diseases of anus and rectum: Secondary | ICD-10-CM

## 2021-11-12 DIAGNOSIS — K602 Anal fissure, unspecified: Secondary | ICD-10-CM | POA: Diagnosis not present

## 2021-11-12 NOTE — Progress Notes (Signed)
HISTORY OF PRESENT ILLNESS: ? ?[name] is a 51 y.o. female, [employer], site, with a history of perirectal fistula requiring surgical anal fistulotomy with seton placement in 2013.  Also family history of colon cancer in her father less than age 72.  She is sent today by her primary care provider regarding perirectal pain.  Patient underwent routine screening colonoscopy April 2019.  She was found to have pan diverticulosis and internal hemorrhoids.  No polyps.  Follow-up in 5 years recommended.  Patient tells me that she was in her usual state of good health until about 6 weeks ago when she developed problems with constipation followed by rectal discomfort and bleeding.  There was a swollen sensation in the perirectal area.  She self-administered sitz bath's.  She saw her primary care provider October 07, 2021.  Rectal exam at that time (records reviewed) was said to show open sores with mild swelling in the perirectal area with mild tenderness in the 6 o'clock position.  No signs of abscess.  She was prescribed hydrocortisone cream.  Patient tells me that her symptoms have resolved.  No active complaints.  She is concerned that she may have recurred fistula. ? ?REVIEW OF SYSTEMS: ? ?All non-GI ROS negative unless otherwise stated in the HPI except for back pain, night sweats, muscle cramps, lower extremity swelling ? ?Past Medical History:  ?Diagnosis Date  ? Anal fistula   ? Asthma   ? controlled  ? Hypertension   ? ? ?Past Surgical History:  ?Procedure Laterality Date  ? ABDOMINAL HYSTERECTOMY    ? ANAL FISTULECTOMY  12/15/11  ? CESAREAN SECTION    ? ? ?Social History ?Sherina Measel Gallop  reports that she has never smoked. She has never used smokeless tobacco. She reports that she does not drink alcohol and does not use drugs. ? ?family history includes Colon cancer in her father; Diabetes in her mother; Heart disease in her maternal grandmother and mother; Other in  her mother. ? ?No Known Allergies ? ?  ? ?PHYSICAL EXAMINATION: ?Vital signs: BP 140/88   Pulse 70   Ht 5\' 3"  (1.6 m)   Wt 231 lb 4 oz (104.9 kg)   SpO2 97%   BMI 40.96 kg/m?   ?Constitutional: generally well-appearing, no acute distress ?Psychiatric: alert and oriented x3, cooperative ?Eyes: extraocular movements intact, anicteric, conjunctiva pink ?Mouth: oral pharynx moist, no lesions ?Neck: supple no lymphadenopathy ?Cardiovascular: heart regular rate and rhythm, no murmur ?Lungs: clear to auscultation bilaterally ?Abdomen: soft, obese, nontender, nondistended, no obvious ascites, no peritoneal signs, normal bowel sounds, no organomegaly ?Rectal: No external abnormalities.  Probable small posterior fissure with very mild tenderness.  No other abnormalities ?Extremities: no clubbing, cyanosis, or lower extremity edema bilaterally ?Skin: no lesions on visible extremities ?Neuro: No focal deficits.  Cranial nerves intact ? ?ASSESSMENT: ? ?1.  Posterior anal fissure healed/healing.  Asymptomatic at this point. ?2.  Family history of colon cancer ?3.  Colonoscopy 2019 with diverticulosis and internal hemorrhoids ?4.  History of perirectal fistula requiring surgical fistulotomy and seton placement 2013 ? ? ?PLAN: ? ?1.  Daily fiber supplementation to keep bowel soft and avoid constipation ?2.  Due for colonoscopy 2024 ?3.  Interval follow-up as needed ?Total time of 45 minutes was spent preparing to see the patient, reviewing outside records, reviewing outside history, obtaining history personally, performing medically appropriate physical exam, educating counseling the patient regarding her above listed issues, making recommendations regarding therapy moving  forward, establishing follow-up, and documenting clinical information in the health record. ? ? ? ? ?  ?

## 2021-11-12 NOTE — Patient Instructions (Signed)
If you are age 53 or older, your body mass index should be between 23-30. Your Body mass index is 40.96 kg/m?Marland Kitchen If this is out of the aforementioned range listed, please consider follow up with your Primary Care Provider. ? ?If you are age 47 or younger, your body mass index should be between 19-25. Your Body mass index is 40.96 kg/m?Marland Kitchen If this is out of the aformentioned range listed, please consider follow up with your Primary Care Provider.  ? ?________________________________________________________ ? ?The [facility] providers would like to encourage you to use [facility] to communicate with providers for non-urgent requests or questions.  Due to long hold times on the telephone, sending your provider a message by [facility] may be a faster and more efficient way to get a response.  Please allow 48 business hours for a response.  Please remember that this is for non-urgent requests.  ?_______________________________________________________ ? ?Take plenty of fiber and follow up as needed ?

## 2022-12-17 ENCOUNTER — Encounter: Payer: Self-pay | Admitting: Internal Medicine

## 2023-04-21 ENCOUNTER — Emergency Department (HOSPITAL_BASED_OUTPATIENT_CLINIC_OR_DEPARTMENT_OTHER): Payer: 59 | Admitting: Radiology

## 2023-04-21 ENCOUNTER — Emergency Department (HOSPITAL_BASED_OUTPATIENT_CLINIC_OR_DEPARTMENT_OTHER)
Admission: EM | Admit: 2023-04-21 | Discharge: 2023-04-21 | Disposition: A | Discharge status: Home / Self Care | Payer: 59 | Attending: Emergency Medicine | Admitting: Emergency Medicine

## 2023-04-21 ENCOUNTER — Other Ambulatory Visit: Payer: Self-pay

## 2023-04-21 DIAGNOSIS — R0602 Shortness of breath: Secondary | ICD-10-CM | POA: Insufficient documentation

## 2023-04-21 DIAGNOSIS — R079 Chest pain, unspecified: Secondary | ICD-10-CM | POA: Diagnosis present

## 2023-04-21 DIAGNOSIS — J45909 Unspecified asthma, uncomplicated: Secondary | ICD-10-CM | POA: Diagnosis not present

## 2023-04-21 DIAGNOSIS — R091 Pleurisy: Secondary | ICD-10-CM | POA: Diagnosis not present

## 2023-04-21 DIAGNOSIS — Z20822 Contact with and (suspected) exposure to covid-19: Secondary | ICD-10-CM | POA: Insufficient documentation

## 2023-04-21 LAB — CBC
HCT: 39.8 % (ref 36.0–46.0)
Hemoglobin: 12.7 g/dL (ref 12.0–15.0)
MCH: 28.1 pg (ref 26.0–34.0)
MCHC: 31.9 g/dL (ref 30.0–36.0)
MCV: 88.1 fL (ref 80.0–100.0)
Platelets: 287 10*3/uL (ref 150–400)
RBC: 4.52 MIL/uL (ref 3.87–5.11)
RDW: 14.9 % (ref 11.5–15.5)
WBC: 6.3 10*3/uL (ref 4.0–10.5)
nRBC: 0 % (ref 0.0–0.2)

## 2023-04-21 LAB — BASIC METABOLIC PANEL
Anion gap: 8 (ref 5–15)
BUN: 14 mg/dL (ref 6–20)
CO2: 30 mmol/L (ref 22–32)
Calcium: 9.5 mg/dL (ref 8.9–10.3)
Chloride: 102 mmol/L (ref 98–111)
Creatinine, Ser: 0.66 mg/dL (ref 0.44–1.00)
GFR, Estimated: >60 mL/min (ref >60)
Glucose, Bld: 95 mg/dL (ref 70–99)
Potassium: 3.5 mmol/L (ref 3.5–5.1)
Sodium: 140 mmol/L (ref 135–145)

## 2023-04-21 LAB — RESP PANEL BY RT-PCR (RSV, FLU A&B, COVID)  RVPGX2
Influenza A by PCR: NEGATIVE
Influenza B by PCR: NEGATIVE
Resp Syncytial Virus by PCR: NEGATIVE
SARS Coronavirus 2 by RT PCR: NEGATIVE

## 2023-04-21 LAB — TROPONIN I (HIGH SENSITIVITY)
Troponin I (High Sensitivity): 5 ng/L (ref <18)
Troponin I (High Sensitivity): 6 ng/L (ref <18)

## 2023-04-21 LAB — D-DIMER, QUANTITATIVE: D-Dimer, Quant: <0.27 ug{FEU}/mL (ref 0.00–0.50)

## 2023-04-21 MED ORDER — BENZONATATE 100 MG PO CAPS
100.0000 mg | ORAL_CAPSULE | Freq: Once | ORAL | Status: AC
  Administered 2023-04-21: 100 mg via ORAL
  Filled 2023-04-21: qty 1

## 2023-04-21 MED ORDER — BENZONATATE 100 MG PO CAPS
100.0000 mg | ORAL_CAPSULE | Freq: Three times a day (TID) | ORAL | 0 refills | Status: AC
Start: 2023-04-21 — End: ?

## 2023-04-21 NOTE — ED Triage Notes (Addendum)
Viral URI for 2 weeks. Comes in today for continued SOB and vomiting today. Eating and drinking well up until today. Afebrile at home. Productive cough.  RT to triage for eval.  HX asthma- albuterol inhaler x4.

## 2023-04-21 NOTE — ED Notes (Signed)
Reviewed AVS with patient, patient expressed understanding of directions, denies further questions at this time. 

## 2023-04-21 NOTE — Discharge Instructions (Addendum)
Please follow-up with your primary care provider if no recent symptoms and ER visit.  Today your labs and imaging were reassuring and you most likely have a viral illness causing your symptoms.  You may take Tylenol ibuprofen every 6 hours needed for pain.  I have prescribed for you Tessalon to help with your cough.  Please remain hydrated and eat food as tolerated.  If symptoms change or worsen please return to ER.

## 2023-04-21 NOTE — ED Provider Notes (Signed)
[place] EMERGENCY DEPARTMENT AT [facility] Provider Note   CSN: [id number] Arrival date & time: 04/21/23  1701     History  Chief Complaint  Patient presents with   Shortness of Breath    [name] is a 52 y.o. female history of asthma presenting with shortness of breath over the past 2 weeks.  Patient was so that she had a viral illness but notes that she has had to use her albuterol inhaler more than usual and is using it every 2 hours today at work.  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  Patient has tried Mucinex over-the-counter to no relief.  Patient denies recent travel/hospitalization/surgery, fevers, previous blood clots, estrogen use, personal history of cancer, leg swelling, calf tenderness.  Patient notes that with the cough she is beginning to have chest pain and did have 1 episode of emesis earlier today due to the cough.    Home Medications Prior to Admission medications   Medication Sig Start Date End Date Taking? Authorizing Provider  benzonatate (TESSALON) 100 MG capsule Take 1 capsule (100 mg total) by mouth every 8 (eight) hours. 04/21/23  Yes Evlyn Kanner T, PA-C  ALBUTEROL IN Inhale into the lungs as needed.    [provider]  Cyanocobalamin (VITAMIN B-12 PO) Take 1 tablet by mouth daily.    [provider]  losartan-hydrochlorothiazide (HYZAAR) 50-12.5 MG tablet Take 1 tablet by mouth daily. 10/06/17   [provider]  montelukast (SINGULAIR) 10 MG tablet Take 1 tablet by mouth daily. 10/06/17   [provider]  Multiple Vitamin (MULTIVITAMIN) tablet Take 1 tablet by mouth daily.    [provider]  Probiotic Product (PROBIOTIC PO) Take 1 tablet by mouth daily.    [provider]      Allergies    Patient has no known allergies.    Review of Systems   Review of Systems  Respiratory:  Positive for shortness of breath.     Physical  Exam Updated Vital Signs BP 130/67   Pulse 85   Temp 98 F (36.7 C) (Oral)   Resp 20   SpO2 96%  Physical Exam Constitutional:      General: She is not in acute distress. Cardiovascular:     Rate and Rhythm: Normal rate and regular rhythm.     Pulses: Normal pulses.     Heart sounds: Normal heart sounds.  Pulmonary:     Effort: Pulmonary effort is normal. No respiratory distress.     Breath sounds: Normal breath sounds.     Comments: Able to speak in full sentences Nonproductive cough Musculoskeletal:     Right lower leg: No tenderness. No edema.     Left lower leg: No tenderness. No edema.  Skin:    General: Skin is warm and dry.  Neurological:     Mental Status: She is alert.  Psychiatric:        Mood and Affect: Mood normal.     ED Results / Procedures / Treatments   Labs (all labs ordered are listed, but only abnormal results are displayed) Labs Reviewed  RESP PANEL BY RT-PCR (RSV, FLU A&B, COVID)  RVPGX2  BASIC METABOLIC PANEL  CBC  D-DIMER, QUANTITATIVE  TROPONIN I (HIGH SENSITIVITY)  TROPONIN I (HIGH SENSITIVITY)    EKG None  Radiology DG Chest 2 View  Result Date: 04/21/2023 CLINICAL DATA:  Shortness of breath. History of asthma. Viral  upper respiratory infection for 2 weeks. Continued shortness of breath and vomiting today. Productive cough. EXAM: CHEST - 2 VIEW COMPARISON:  10/13/2011 FINDINGS: Heart size and pulmonary vascularity are normal. Shallow inspiration with elevation of the right hemidiaphragm. No airspace disease or consolidation in the lungs. No pleural effusions. No pneumothorax. Mediastinal contours appear intact. Degenerative changes in the spine. IMPRESSION: Shallow inspiration.  No evidence of active pulmonary disease. Electronically Signed   By: Burman Nieves M.D.   On: 04/21/2023 20:14    Procedures Procedures    Medications Ordered in ED Medications  benzonatate (TESSALON) capsule 100 mg (has no administration in time range)     ED Course/ Medical Decision Making/ A&P                                 Medical Decision Making Amount and/or Complexity of Data Reviewed Labs: ordered. Radiology: ordered.  Risk Prescription drug management.   [place] Dold Current 52 y.o. presented today for shortness of breath.  Working DDx that I considered at this time includes, but not limited to, asthma/COPD exacerbation, URI, viral illness, anemia, ACS, PE, pneumonia, pleural effusion, lung cancer, pleurisy.  R/o DDx: asthma/COPD exacerbation, anemia, ACS, PE, pneumonia, pleural effusion, lung cancer: These are considered less likely due to history of present illness, physical exam, labs/imaging findings  Review of prior external notes: 04/08/2023 office visit  Unique Tests and My Interpretation:  CBC: Unremarkable BMP: Unremarkable EKG: Sinus 87 bpm, no ST elevations or blocks noted CXR: Unremarkable D-dimer: Unremarkable Respiratory Panel: Unremarkable Troponin: 5, 6  Discussion with Independent Historian:  Friend  Discussion of Management of Tests: None  Risk: Medium: prescription drug management  Risk Stratification Score:  None  Plan: On exam patient was in no acute distress with stable vitals.  Patient was having nonproductive cough in the room but otherwise had unremarkable physical exam.  Patient's labs from triage are reassuring however patient is endorsing shortness of breath for the past 2 weeks and although I believe this to be viral in nature a D-dimer was ordered to rule out blood clot.  D-dimer came back negative.  At this time of high suspicion patient's symptoms are viral in nature or could be pleurisy.  Patient was given 1 dose of Tessalon here in the ED and then given a prescription for this as well.  I encouraged symptomatic management including Tylenol and ibuprofen every 6 hours and remain hydrated and eat food as tolerated and to follow-up with her primary care provider.  Patient given work  note.  Patient was given return precautions. Patient stable for discharge at this time.  Patient verbalized understanding of plan.         Final Clinical Impression(s) / ED Diagnoses Final diagnoses:  Pleurisy    Rx / DC Orders ED Discharge Orders          Ordered    benzonatate (TESSALON) 100 MG capsule  Every 8 hours        04/21/23 2333              Netta Corrigan, PA-C 04/21/23 2338    Rexford Maus, DO 04/24/23 [phone]

## 2023-10-14 ENCOUNTER — Encounter: Payer: Self-pay | Admitting: Family Medicine

## 2023-10-14 ENCOUNTER — Ambulatory Visit (INDEPENDENT_AMBULATORY_CARE_PROVIDER_SITE_OTHER): Payer: 59 | Admitting: Family Medicine

## 2023-10-14 VITALS — BP 120/70 | HR 94 | Temp 98.2°F | Ht 63.0 in | Wt 240.0 lb

## 2023-10-14 DIAGNOSIS — Z833 Family history of diabetes mellitus: Secondary | ICD-10-CM

## 2023-10-14 DIAGNOSIS — I10 Essential (primary) hypertension: Secondary | ICD-10-CM

## 2023-10-14 DIAGNOSIS — Z800 Family history of malignant neoplasm of digestive organs: Secondary | ICD-10-CM

## 2023-10-14 DIAGNOSIS — Z23 Encounter for immunization: Secondary | ICD-10-CM | POA: Diagnosis not present

## 2023-10-14 DIAGNOSIS — Z7689 Persons encountering health services in other specified circumstances: Secondary | ICD-10-CM

## 2023-10-14 DIAGNOSIS — E661 Drug-induced obesity: Secondary | ICD-10-CM

## 2023-10-14 DIAGNOSIS — J4520 Mild intermittent asthma, uncomplicated: Secondary | ICD-10-CM | POA: Diagnosis not present

## 2023-10-14 DIAGNOSIS — Z83438 Family history of other disorder of lipoprotein metabolism and other lipidemia: Secondary | ICD-10-CM

## 2023-10-14 DIAGNOSIS — E66813 Obesity, class 3: Secondary | ICD-10-CM | POA: Diagnosis not present

## 2023-10-14 DIAGNOSIS — Z6841 Body Mass Index (BMI) 40.0 and over, adult: Secondary | ICD-10-CM

## 2023-10-14 LAB — POCT URINALYSIS DIPSTICK
Bilirubin, UA: NEGATIVE
Blood, UA: NEGATIVE
Glucose, UA: NEGATIVE
Ketones, UA: NEGATIVE
Leukocytes, UA: NEGATIVE
Nitrite, UA: NEGATIVE
Protein, UA: NEGATIVE
Spec Grav, UA: 1.025 (ref 1.010–1.025)
Urobilinogen, UA: 0.2 U/dL
pH, UA: 5.5 (ref 5.0–8.0)

## 2023-10-14 NOTE — Progress Notes (Signed)
 I,Jameka J Llittleton, CMA,acting as a Neurosurgeon for [employer], NP.,have documented all relevant documentation on the behalf of Ellender Hose, NP,as directed by  Ellender Hose, NP while in the presence of Ellender Hose, NP.  Subjective:  Patient ID: [name] , female    DOB: 1970/08/27 , 53 y.o.   MRN: [id number]  Chief Complaint  Patient presents with   Hypertension    HPI  Patient is a 53 year old female who presents today to establish Primary care.  Patient would like to be seen for her blood pressure. She reports compliance with her meds. Patient denies having chest pain, sob or headaches at this time. Patient doesn't have any other questions or concerns at this time.  Hypertension This is a chronic problem. The current episode started more than 1 year ago. The problem has been waxing and waning since onset. The problem is controlled. Pertinent negatives include no chest pain or headaches. Risk factors for coronary artery disease include family history and post-menopausal state. Past treatments include angiotensin blockers and diuretics. The current treatment provides significant improvement. Compliance problems include diet.      Past Medical History:  Diagnosis Date   Anal fistula    Asthma    controlled   Hypertension      Family History  Problem Relation Age of Onset   Heart disease Mother    Diabetes Mother    Other Mother        polymysitis   Colon cancer Father    Kidney failure Brother    Heart disease Maternal Grandmother    Rectal cancer Neg Hx    Stomach cancer Neg Hx      Current Outpatient Medications:    ALBUTEROL IN, Inhale into the lungs as needed., Disp: , Rfl:    benzonatate (TESSALON) 100 MG capsule, Take 1 capsule (100 mg total) by mouth every 8 (eight) hours., Disp: 21 capsule, Rfl: 0   Cyanocobalamin (VITAMIN B-12 PO), Take 1 tablet by mouth daily., Disp: , Rfl:    losartan-hydrochlorothiazide (HYZAAR) 50-12.5 MG tablet, Take 1 tablet by mouth  daily., Disp: , Rfl: 3   montelukast (SINGULAIR) 10 MG tablet, Take 1 tablet by mouth daily., Disp: , Rfl: 5   Multiple Vitamin (MULTIVITAMIN) tablet, Take 1 tablet by mouth daily., Disp: , Rfl:    Probiotic Product (PROBIOTIC PO), Take 1 tablet by mouth daily., Disp: , Rfl:   Current Facility-Administered Medications:    0.9 %  sodium chloride infusion, 500 mL, Intravenous, Once, Hilarie Fredrickson, MD   No Known Allergies   Review of Systems  Constitutional: Negative.   HENT: Negative.    Eyes: Negative.   Respiratory: Negative.    Cardiovascular:  Negative for chest pain.  Endocrine: Negative for polydipsia, polyphagia and polyuria.  Neurological:  Negative for headaches.     Today's Vitals   10/14/23 1519  BP: 120/70  Pulse: 94  Temp: 98.2 F (36.8 C)  TempSrc: Oral  SpO2: 94%  Weight: 240 lb (108.9 kg)  Height: 5\' 3"  (1.6 m)  PainSc: 0-No pain   Body mass index is 42.51 kg/m.  Wt Readings from Last 3 Encounters:  10/14/23 240 lb (108.9 kg)  11/12/21 231 lb 4 oz (104.9 kg)  11/20/17 231 lb (104.8 kg)     Objective:  Physical Exam HENT:     Head: Normocephalic.  Cardiovascular:     Rate and Rhythm: Normal rate.     Pulses: Normal pulses.  Abdominal:  General: Bowel sounds are normal.  Neurological:     General: No focal deficit present.     Mental Status: She is alert and oriented to person, place, and time.  Psychiatric:        Mood and Affect: Mood normal.        Behavior: Behavior normal.         Assessment And Plan:  Establishing care with new doctor, encounter for  Hypertension, unspecified type Assessment & Plan: Chronic.On Hyzaar 50-12.5 mg every day   Orders: -     POCT urinalysis dipstick -     Microalbumin / creatinine urine ratio -     EKG 12-Lead -     CBC -     Basic metabolic panel  Family history of diabetes mellitus in mother -     Hemoglobin A1c  Mild intermittent asthma, unspecified whether complicated Assessment &  Plan: Chronic. Stable. On singulair 10 mg every day and Albuterol inhaler Q 6hrs PRN   Immunization due -     Flu vaccine trivalent PF, 6mos and older(Flulaval,Afluria,Fluarix,Fluzone)  FH: colon cancer in first degree relative <43 years old -     Ambulatory referral to Gastroenterology -     Hemoglobin A1c  Family history of elevated blood lipids Assessment & Plan: Low fat diet advised.  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 Advised to aim for at least 150 minutes of exercise per week.       Return in about 3 months (around 01/11/2024) for bpc, physical.  Patient was given opportunity to ask questions. Patient verbalized understanding of the plan and was able to repeat key elements of the plan. All questions were answered to their satisfaction.  Ellender Hose, NP  I, Ellender Hose, NP, have reviewed all documentation for this visit. The documentation on 10/20/2023 for the exam, diagnosis, procedures, and orders are all accurate and complete.   IF YOU HAVE BEEN REFERRED TO A SPECIALIST, IT MAY TAKE 1-2 WEEKS TO SCHEDULE/PROCESS THE REFERRAL. IF YOU HAVE NOT HEARD FROM US/SPECIALIST IN TWO WEEKS, PLEASE GIVE [place] A CALL AT [phone].   THE PATIENT IS ENCOURAGED TO PRACTICE SOCIAL DISTANCING DUE TO THE COVID-19 PANDEMIC.

## 2023-10-14 NOTE — Patient Instructions (Signed)
 Hypertension, Adult Hypertension is another name for high blood pressure. High blood pressure forces your heart to work harder to pump blood. This can cause problems over time. There are two numbers in a blood pressure reading. There is a top number (systolic) over a bottom number (diastolic). It is best to have a blood pressure that is below 120/80. What are the causes? The cause of this condition is not known. Some other conditions can lead to high blood pressure. What increases the risk? Some lifestyle factors can make you more likely to develop high blood pressure: Smoking. Not getting enough exercise or physical activity. Being overweight. Having too much fat, sugar, calories, or salt (sodium) in your diet. Drinking too much alcohol. Other risk factors include: Having any of these conditions: Heart disease. Diabetes. High cholesterol. Kidney disease. Obstructive sleep apnea. Having a family history of high blood pressure and high cholesterol. Age. The risk increases with age. Stress. What are the signs or symptoms? High blood pressure may not cause symptoms. Very high blood pressure (hypertensive crisis) may cause: Headache. Fast or uneven heartbeats (palpitations). Shortness of breath. Nosebleed. Vomiting or feeling like you may vomit (nauseous). Changes in how you see. Very bad chest pain. Feeling dizzy. Seizures. How is this treated? This condition is treated by making healthy lifestyle changes, such as: Eating healthy foods. Exercising more. Drinking less alcohol. Your doctor may prescribe medicine if lifestyle changes do not help enough and if: Your top number is above 130. Your bottom number is above 80. Your personal target blood pressure may vary. Follow these instructions at home: Eating and drinking  If told, follow the DASH eating plan. To follow this plan: Fill one half of your plate at each meal with fruits and vegetables. Fill one fourth of your plate  at each meal with whole grains. Whole grains include whole-wheat pasta, brown rice, and whole-grain bread. Eat or drink low-fat dairy products, such as skim milk or low-fat yogurt. Fill one fourth of your plate at each meal with low-fat (lean) proteins. Low-fat proteins include fish, chicken without skin, eggs, beans, and tofu. Avoid fatty meat, cured and processed meat, or chicken with skin. Avoid pre-made or processed food. Limit the amount of salt in your diet to less than 1,500 mg each day. Do not drink alcohol if: Your doctor tells you not to drink. You are pregnant, may be pregnant, or are planning to become pregnant. If you drink alcohol: Limit how much you have to: 0-1 drink a day for women. 0-2 drinks a day for men. Know how much alcohol is in your drink. In the [place] one drink equals one 12 oz bottle of beer (355 mL), one 5 oz glass of wine (148 mL), or one 1 oz glass of hard liquor (44 mL). Lifestyle  Work with your doctor to stay at a healthy weight or to lose weight. Ask your doctor what the best weight is for you. Get at least 30 minutes of exercise that causes your heart to beat faster (aerobic exercise) most days of the week. This may include walking, swimming, or biking. Get at least 30 minutes of exercise that strengthens your muscles (resistance exercise) at least 3 days a week. This may include lifting weights or doing Pilates. Do not smoke or use any products that contain nicotine or tobacco. If you need help quitting, ask your doctor. Check your blood pressure at home as told by your doctor. Keep all follow-up visits. Medicines Take over-the-counter and prescription medicines  only as told by your doctor. Follow directions carefully. Do not skip doses of blood pressure medicine. The medicine does not work as well if you skip doses. Skipping doses also puts you at risk for problems. Ask your doctor about side effects or reactions to medicines that you should watch  for. Contact a doctor if: You think you are having a reaction to the medicine you are taking. You have headaches that keep coming back. You feel dizzy. You have swelling in your ankles. You have trouble with your vision. Get help right away if: You get a very bad headache. You start to feel mixed up (confused). You feel weak or numb. You feel faint. You have very bad pain in your: Chest. Belly (abdomen). You vomit more than once. You have trouble breathing. These symptoms may be an emergency. Get help right away. Call 911. Do not wait to see if the symptoms will go away. Do not drive yourself to the hospital. Summary Hypertension is another name for high blood pressure. High blood pressure forces your heart to work harder to pump blood. For most people, a normal blood pressure is less than 120/80. Making healthy choices can help lower blood pressure. If your blood pressure does not get lower with healthy choices, you may need to take medicine. This information is not intended to replace advice given to you by your health care provider. Make sure you discuss any questions you have with your health care provider. Document Revised: 05/23/2021 Document Reviewed: 05/23/2021 Elsevier Patient Education  2024 [employer].

## 2023-10-15 LAB — CBC
Hematocrit: 41.5 % (ref 34.0–46.6)
Hemoglobin: 12.9 g/dL (ref 11.1–15.9)
MCH: 27.4 pg (ref 26.6–33.0)
MCHC: 31.1 g/dL — ABNORMAL LOW (ref 31.5–35.7)
MCV: 88 fL (ref 79–97)
Platelets: 305 10*3/uL (ref 150–450)
RBC: 4.71 x10E6/uL (ref 3.77–5.28)
RDW: 13.4 % (ref 11.7–15.4)
WBC: 6.7 10*3/uL (ref 3.4–10.8)

## 2023-10-15 LAB — BASIC METABOLIC PANEL
BUN/Creatinine Ratio: 16 (ref 9–23)
BUN: 12 mg/dL (ref 6–24)
CO2: 27 mmol/L (ref 20–29)
Calcium: 10.5 mg/dL — ABNORMAL HIGH (ref 8.7–10.2)
Chloride: 102 mmol/L (ref 96–106)
Creatinine, Ser: 0.76 mg/dL (ref 0.57–1.00)
Glucose: 90 mg/dL (ref 70–99)
Potassium: 4.4 mmol/L (ref 3.5–5.2)
Sodium: 143 mmol/L (ref 134–144)
eGFR: 92 mL/min/{1.73_m2} (ref >59)

## 2023-10-15 LAB — HEMOGLOBIN A1C
Est. average glucose Bld gHb Est-mCnc: 131 mg/dL
Hgb A1c MFr Bld: 6.2 % — ABNORMAL HIGH (ref 4.8–5.6)

## 2023-10-15 LAB — LIPID PANEL
Chol/HDL Ratio: 2.6 ratio (ref 0.0–4.4)
Cholesterol, Total: 165 mg/dL (ref 100–199)
HDL: 64 mg/dL (ref >39)
LDL Chol Calc (NIH): 85 mg/dL (ref 0–99)
Triglycerides: 85 mg/dL (ref 0–149)
VLDL Cholesterol Cal: 16 mg/dL (ref 5–40)

## 2023-10-15 LAB — MICROALBUMIN / CREATININE URINE RATIO
Creatinine, Urine: 185.9 mg/dL
Microalb/Creat Ratio: 5 mg/g{creat} (ref 0–29)
Microalbumin, Urine: 10 ug/mL

## 2023-10-20 DIAGNOSIS — Z23 Encounter for immunization: Secondary | ICD-10-CM | POA: Insufficient documentation

## 2023-10-20 DIAGNOSIS — I10 Essential (primary) hypertension: Secondary | ICD-10-CM | POA: Insufficient documentation

## 2023-10-20 DIAGNOSIS — Z83438 Family history of other disorder of lipoprotein metabolism and other lipidemia: Secondary | ICD-10-CM | POA: Insufficient documentation

## 2023-10-20 DIAGNOSIS — Z833 Family history of diabetes mellitus: Secondary | ICD-10-CM | POA: Insufficient documentation

## 2023-10-20 DIAGNOSIS — Z7689 Persons encountering health services in other specified circumstances: Secondary | ICD-10-CM | POA: Insufficient documentation

## 2023-10-20 DIAGNOSIS — E66813 Obesity, class 3: Secondary | ICD-10-CM | POA: Insufficient documentation

## 2023-10-20 DIAGNOSIS — Z800 Family history of malignant neoplasm of digestive organs: Secondary | ICD-10-CM | POA: Insufficient documentation

## 2023-10-20 DIAGNOSIS — J4520 Mild intermittent asthma, uncomplicated: Secondary | ICD-10-CM | POA: Insufficient documentation

## 2023-10-20 NOTE — Assessment & Plan Note (Signed)
 Chronic.On Hyzaar 50-12.5 mg every day

## 2023-10-20 NOTE — Assessment & Plan Note (Signed)
 Low fat diet advised.

## 2023-10-20 NOTE — Progress Notes (Signed)
 Cholesterol levels are normal.Urine levels are normal. A1c is 6.2, so this is pre-diabetes. Low carbs diet and exercises  advised.

## 2023-10-20 NOTE — Assessment & Plan Note (Signed)
 Chronic. Stable. On singulair 10 mg every day and Albuterol inhaler Q 6hrs PRN

## 2023-10-20 NOTE — Assessment & Plan Note (Signed)
 She is encouraged to strive for BMI less than 30 to decrease cardiac risk. Advised to aim for at least 150 minutes of exercise per week.

## 2023-11-05 ENCOUNTER — Encounter: Payer: Self-pay | Admitting: Family Medicine

## 2023-11-05 ENCOUNTER — Ambulatory Visit: Payer: Self-pay

## 2023-11-05 ENCOUNTER — Ambulatory Visit (INDEPENDENT_AMBULATORY_CARE_PROVIDER_SITE_OTHER): Admitting: Family Medicine

## 2023-11-05 VITALS — BP 120/64 | HR 104 | Temp 99.2°F | Ht 63.0 in | Wt 240.0 lb

## 2023-11-05 DIAGNOSIS — R051 Acute cough: Secondary | ICD-10-CM

## 2023-11-05 DIAGNOSIS — I10 Essential (primary) hypertension: Secondary | ICD-10-CM | POA: Diagnosis not present

## 2023-11-05 DIAGNOSIS — J4541 Moderate persistent asthma with (acute) exacerbation: Secondary | ICD-10-CM | POA: Diagnosis not present

## 2023-11-05 DIAGNOSIS — R509 Fever, unspecified: Secondary | ICD-10-CM

## 2023-11-05 DIAGNOSIS — J9801 Acute bronchospasm: Secondary | ICD-10-CM

## 2023-11-05 LAB — POCT INFLUENZA A/B
Influenza A, POC: NEGATIVE
Influenza B, POC: NEGATIVE

## 2023-11-05 LAB — POC COVID19 BINAXNOW: SARS Coronavirus 2 Ag: NEGATIVE

## 2023-11-05 MED ORDER — PREDNISONE 10 MG (21) PO TBPK: ORAL_TABLET | ORAL | 0 refills | Status: DC

## 2023-11-05 MED ORDER — LOSARTAN POTASSIUM-HCTZ 50-12.5 MG PO TABS
1.0000 | ORAL_TABLET | Freq: Every day | ORAL | 2 refills | Status: AC
Start: 2023-11-05 — End: ?

## 2023-11-05 MED ORDER — AIRSUPRA 90-80 MCG/ACT IN AERO
2.0000 | INHALATION_SPRAY | RESPIRATORY_TRACT | 2 refills | Status: AC | PRN
Start: 2023-11-05 — End: ?

## 2023-11-05 MED ORDER — TRIAMCINOLONE ACETONIDE 40 MG/ML IJ SUSP
60.0000 mg | Freq: Once | INTRAMUSCULAR | Status: AC
Start: 2023-11-05 — End: 2023-11-05
  Administered 2023-11-05: 60 mg via INTRAMUSCULAR

## 2023-11-05 MED ORDER — MONTELUKAST SODIUM 10 MG PO TABS: 10.0000 mg | ORAL_TABLET | Freq: Every day | ORAL | 5 refills | Status: AC

## 2023-11-05 MED ORDER — PROMETHAZINE-DM 6.25-15 MG/5ML PO SYRP: 5.0000 mL | ORAL_SOLUTION | Freq: Two times a day (BID) | ORAL | 0 refills | Status: DC | PRN

## 2023-11-05 NOTE — Progress Notes (Signed)
 I,Jameka J Llittleton, CMA,acting as a Neurosurgeon for [employer], NP.,have documented all relevant documentation on the behalf of Ellender Hose, NP,as directed by  Ellender Hose, NP while in the presence of Ellender Hose, NP.  Subjective:  Patient ID: [name] , female    DOB: 11-Apr-1971 , 53 y.o.   MRN: [id number]  Chief Complaint  Patient presents with   URI    HPI  Patient is a 53 year old female who presents  today for evaluation of cold symptoms and they are nasal congestion, dry cough, watery eyes, sneezing, chills that started on Monday. Patient states that she had a low grade fever at home but her temperature is 99.73F in the clinic. Will test for COVID/influenza to rule out.Patient also has a diagnosis of asthma.  URI  This is a new problem. The current episode started in the past 7 days. The problem has been gradually worsening. Associated symptoms include congestion, coughing, headaches, rhinorrhea, sinus pain and sneezing. Pertinent negatives include no ear pain. Associated symptoms comments: Chest tightness . She has tried antihistamine for the symptoms. The treatment provided no relief.     Past Medical History:  Diagnosis Date   Anal fistula    Asthma    controlled   Hypertension      Family History  Problem Relation Age of Onset   Heart disease Mother    Diabetes Mother    Other Mother        polymysitis   Colon cancer Father    Kidney failure Brother    Heart disease Maternal Grandmother    Rectal cancer Neg Hx    Stomach cancer Neg Hx      Current Outpatient Medications:    ALBUTEROL IN, Inhale into the lungs as needed., Disp: , Rfl:    Albuterol-Budesonide (AIRSUPRA) 90-80 MCG/ACT AERO, Inhale 2 puffs into the lungs every 4 (four) hours as needed., Disp: 32.1 g, Rfl: 2   benzonatate (TESSALON) 100 MG capsule, Take 1 capsule (100 mg total) by mouth every 8 (eight) hours., Disp: 21 capsule, Rfl: 0   Cyanocobalamin (VITAMIN B-12 PO), Take 1 tablet by mouth  daily., Disp: , Rfl:    Multiple Vitamin (MULTIVITAMIN) tablet, Take 1 tablet by mouth daily., Disp: , Rfl:    predniSONE (STERAPRED UNI-PAK 21 TAB) 10 MG (21) TBPK tablet, Take as prescribed, Disp: 21 tablet, Rfl: 0   Probiotic Product (PROBIOTIC PO), Take 1 tablet by mouth daily., Disp: , Rfl:    promethazine-dextromethorphan (PROMETHAZINE-DM) 6.25-15 MG/5ML syrup, Take 5 mLs by mouth 2 (two) times daily as needed., Disp: 118 mL, Rfl: 0   losartan-hydrochlorothiazide (HYZAAR) 50-12.5 MG tablet, Take 1 tablet by mouth daily., Disp: 90 tablet, Rfl: 2   montelukast (SINGULAIR) 10 MG tablet, Take 1 tablet (10 mg total) by mouth daily., Disp: 90 tablet, Rfl: 5  Current Facility-Administered Medications:    0.9 %  sodium chloride infusion, 500 mL, Intravenous, Once, Hilarie Fredrickson, MD   No Known Allergies   Review of Systems  HENT:  Positive for congestion, rhinorrhea, sinus pain and sneezing. Negative for ear pain.   Respiratory:  Positive for cough and shortness of breath.   Neurological:  Positive for headaches.     Today's Vitals   11/05/23 1541  BP: 120/64  Pulse: (!) 104  Temp: 99.2 F (37.3 C)  TempSrc: Oral  Weight: 240 lb (108.9 kg)  Height: 5\' 3"  (1.6 m)  PainSc: 0-No pain   Body mass index  is 42.51 kg/m.  Wt Readings from Last 3 Encounters:  11/05/23 240 lb (108.9 kg)  10/14/23 240 lb (108.9 kg)  11/12/21 231 lb 4 oz (104.9 kg)    The 10-year ASCVD risk score (Arnett DK, et al., 2019) is: 2.9%   Values used to calculate the score:     Age: 50 years     Sex: Female     Is Non-Hispanic African American: Yes     Diabetic: No     Tobacco smoker: No     Systolic Blood Pressure: 120 mmHg     Is BP treated: Yes     HDL Cholesterol: 64 mg/dL     Total Cholesterol: 165 mg/dL  Objective:  Physical Exam HENT:     Head: Normocephalic.  Cardiovascular:     Rate and Rhythm: Tachycardia present.  Pulmonary:     Breath sounds: Examination of the left-middle field reveals  wheezing. Wheezing present. No rhonchi.  Neurological:     Mental Status: She is alert.         Assessment And Plan:  Moderate persistent asthma with exacerbation -     Montelukast Sodium; Take 1 tablet (10 mg total) by mouth daily.  Dispense: 90 tablet; Refill: 5 -     predniSONE; Take as prescribed  Dispense: 21 tablet; Refill: 0 -     Airsupra; Inhale 2 puffs into the lungs every 4 (four) hours as needed.  Dispense: 32.1 g; Refill: 2  Bronchospasm -     Triamcinolone Acetonide -     Airsupra; Inhale 2 puffs into the lungs every 4 (four) hours as needed.  Dispense: 32.1 g; Refill: 2  Acute cough -     POCT Influenza A/B -     POC COVID-19 BinaxNow -     Promethazine-DM; Take 5 mLs by mouth 2 (two) times daily as needed.  Dispense: 118 mL; Refill: 0  Primary hypertension Assessment & Plan: Chronic. Well controlled, continue  Hyzaar 50-12.5 mg every day   Orders: -     Losartan Potassium-HCTZ; Take 1 tablet by mouth daily.  Dispense: 90 tablet; Refill: 2    Return if symptoms worsen or fail to improve, for keep your next appt.  Patient was given opportunity to ask questions. Patient verbalized understanding of the plan and was able to repeat key elements of the plan. All questions were answered to their satisfaction.    I, Ellender Hose, NP, have reviewed all documentation for this visit. The documentation on 11/15/2023 for the exam, diagnosis, procedures, and orders are all accurate and complete.    IF YOU HAVE BEEN REFERRED TO A SPECIALIST, IT MAY TAKE 1-2 WEEKS TO SCHEDULE/PROCESS THE REFERRAL. IF YOU HAVE NOT HEARD FROM US/SPECIALIST IN TWO WEEKS, PLEASE GIVE [place] A CALL AT [phone].

## 2023-11-05 NOTE — Telephone Encounter (Signed)
 Copied from CRM [id number]. Topic: Clinical - Red Word Triage >> Nov 05, 2023  8:11 AM Izetta Dakin wrote: [facility] that prompted transfer to Nurse Triage: Asthma, heaviness in chest   Chief Complaint: Chest tight Symptoms: Cough, sinus drainage Frequency: Ongoing since yesterday Pertinent Negatives: Patient denies SOB, chest pain Disposition: [] ED /[] Urgent Care (no appt availability in office) / [x] Appointment(In office/virtual)/ []  [facility]/ [] Home Care/ [] Refused Recommended Disposition /[] [place] [facility]/ []  Follow-up with PCP Additional Notes: Patient stated that she is having a cough, sinus drainage/pressure, and tightness in the chest. Patient requesting appointment. Appointment scheduled for this afternoon.   Reason for Disposition  Cough with cold symptoms (e.g., runny nose, postnasal drip, throat clearing)  Answer Assessment - Initial Assessment Questions 1. ONSET: "When did the cough begin?"      Yesterday  2. SEVERITY: "How bad is the cough today?"      Moderate  3. SPUTUM: "Describe the color of your sputum" (none, dry cough; clear, white, yellow, green)     Clear  4. DIFFICULTY BREATHING: "Are you having difficulty breathing?" If Yes, ask: "How bad is it?" (e.g., mild, moderate, severe)      No  5. FEVER: "Do you have a fever?" If Yes, ask: "What is your temperature, how was it measured, and when did it start?"     Felt hot earlier but not now  6. LUNG HISTORY: "Do you have any history of lung disease?"  (e.g., pulmonary embolus, asthma, emphysema)    Asthma  7. OTHER SYMPTOMS: "Do you have any other symptoms?" (e.g., runny nose, wheezing, chest pain)       Chest tightness, drainage, sinus pressure  Protocols used: Cough - Acute Productive-A-AH

## 2023-11-15 DIAGNOSIS — R051 Acute cough: Secondary | ICD-10-CM | POA: Insufficient documentation

## 2023-11-15 DIAGNOSIS — J9801 Acute bronchospasm: Secondary | ICD-10-CM | POA: Insufficient documentation

## 2023-11-15 DIAGNOSIS — J4541 Moderate persistent asthma with (acute) exacerbation: Secondary | ICD-10-CM | POA: Insufficient documentation

## 2023-11-15 NOTE — Assessment & Plan Note (Signed)
 Chronic. Well controlled, continue  Hyzaar 50-12.5 mg every day

## 2024-01-13 ENCOUNTER — Encounter: Payer: 59 | Admitting: Family Medicine

## 2024-01-20 ENCOUNTER — Encounter: Payer: Self-pay | Admitting: Family Medicine

## 2024-03-18 ENCOUNTER — Encounter: Payer: Self-pay | Admitting: Family Medicine

## 2024-03-18 ENCOUNTER — Ambulatory Visit (INDEPENDENT_AMBULATORY_CARE_PROVIDER_SITE_OTHER): Payer: Self-pay | Admitting: Family Medicine

## 2024-03-18 VITALS — BP 116/80 | HR 85 | Temp 98.4°F | Wt 244.0 lb

## 2024-03-18 DIAGNOSIS — N951 Menopausal and female climacteric states: Secondary | ICD-10-CM | POA: Diagnosis not present

## 2024-03-18 DIAGNOSIS — R7303 Prediabetes: Secondary | ICD-10-CM | POA: Diagnosis not present

## 2024-03-18 DIAGNOSIS — Z114 Encounter for screening for human immunodeficiency virus [HIV]: Secondary | ICD-10-CM

## 2024-03-18 DIAGNOSIS — Z23 Encounter for immunization: Secondary | ICD-10-CM

## 2024-03-18 DIAGNOSIS — I10 Essential (primary) hypertension: Secondary | ICD-10-CM | POA: Diagnosis not present

## 2024-03-18 DIAGNOSIS — Z2821 Immunization not carried out because of patient refusal: Secondary | ICD-10-CM

## 2024-03-18 DIAGNOSIS — Z136 Encounter for screening for cardiovascular disorders: Secondary | ICD-10-CM

## 2024-03-18 DIAGNOSIS — Z5320 Procedure and treatment not carried out because of patient's decision for unspecified reasons: Secondary | ICD-10-CM | POA: Insufficient documentation

## 2024-03-18 DIAGNOSIS — M5416 Radiculopathy, lumbar region: Secondary | ICD-10-CM | POA: Diagnosis not present

## 2024-03-18 DIAGNOSIS — Z1159 Encounter for screening for other viral diseases: Secondary | ICD-10-CM

## 2024-03-18 DIAGNOSIS — Z1211 Encounter for screening for malignant neoplasm of colon: Secondary | ICD-10-CM

## 2024-03-18 DIAGNOSIS — Z0000 Encounter for general adult medical examination without abnormal findings: Secondary | ICD-10-CM

## 2024-03-18 DIAGNOSIS — Z6841 Body Mass Index (BMI) 40.0 and over, adult: Secondary | ICD-10-CM

## 2024-03-18 DIAGNOSIS — E66813 Obesity, class 3: Secondary | ICD-10-CM

## 2024-03-18 DIAGNOSIS — E661 Drug-induced obesity: Secondary | ICD-10-CM

## 2024-03-18 DIAGNOSIS — Z90711 Acquired absence of uterus with remaining cervical stump: Secondary | ICD-10-CM

## 2024-03-18 MED ORDER — ESCITALOPRAM OXALATE 10 MG PO TABS: 10.0000 mg | ORAL_TABLET | Freq: Every day | ORAL | 0 refills | Status: AC

## 2024-03-18 MED ORDER — METHOCARBAMOL 750 MG PO TABS
750.0000 mg | ORAL_TABLET | Freq: Three times a day (TID) | ORAL | 0 refills | Status: AC | PRN
Start: 2024-03-18 — End: ?

## 2024-03-18 MED ORDER — DICLOFENAC SODIUM 75 MG PO TBEC: 75.0000 mg | DELAYED_RELEASE_TABLET | Freq: Two times a day (BID) | ORAL | 1 refills | Status: AC

## 2024-03-18 NOTE — Patient Instructions (Signed)

## 2024-03-18 NOTE — Progress Notes (Signed)
 I,Jameka J Llittleton, CMA,acting as a Neurosurgeon for [employer], NP.,have documented all relevant documentation on the behalf of Monique Creighton, NP,as directed by  Monique Creighton, NP while in the presence of Monique Creighton, NP.  Subjective:    Patient ID: [name] , female    DOB: 19-Dec-1970 , 53 y.o.   MRN: [id number]  Chief Complaint  Patient presents with   Annual Exam    Patient presents today for a physical. Patient reports compliance with her meds. Patient denies having chest pain,sob or headaches at this time. Patient is also concerned about her back. She has been having pain in her lower back pain for a while now.  The pain radiates down her leg. She has taken ibuprofen  and tylenol  but that hasn't helped. She thinks she was previously getting injections before covid to help with the pain.     HPI Discussed the use of AI scribe software for clinical note transcription with the patient, who gave verbal consent to proceed.  History of Present Illness     [name] is a 53 year old female who presents for an annual physical exam.  She has been experiencing worsening back pain over the past few years and was previously told she has degenerative disc disease. She previously received injections for pain management but discontinued them due to the COVID pandemic. She consulted an orthopedic specialist who suggested surgery or medication, but she prefers to avoid drugs. She currently uses ibuprofen  for severe pain, which causes nausea, and has not seen the orthopedic specialist in the past year.  She underwent a partial hysterectomy 18 years ago due to severe menstrual periods and has not had regular Pap smears since being informed they were unnecessary. She recalls having a mammogram this year at an imaging center across from the hospital, but the results are not available in the current records.  She is prediabetic and is due for an A1c check. She attempts to exercise by going to the gym  once a week and walking the track, although this is not consistent. She experiences menopausal symptoms, including hot flashes and mood changes, and has difficulty losing weight despite trying to eat healthily and being active at work. She takes a multivitamin and a probiotic daily.  She has asthma and uses her inhaler as needed. She performs monthly self-breast exams, primarily in the shower.      Past Medical History:  Diagnosis Date   Anal fistula    Asthma    controlled   Hypertension      Family History  Problem Relation Age of Onset   Heart disease Mother    Diabetes Mother    Other Mother        polymysitis   Colon cancer Father    Kidney failure Brother    Heart disease Maternal Grandmother    Rectal cancer Neg Hx    Stomach cancer Neg Hx      Current Outpatient Medications:    ALBUTEROL  IN, Inhale into the lungs as needed., Disp: , Rfl:    Albuterol -Budesonide (AIRSUPRA ) 90-80 MCG/ACT AERO, Inhale 2 puffs into the lungs every 4 (four) hours as needed., Disp: 32.1 g, Rfl: 2   benzonatate  (TESSALON ) 100 MG capsule, Take 1 capsule (100 mg total) by mouth every 8 (eight) hours., Disp: 21 capsule, Rfl: 0   Cyanocobalamin (VITAMIN B-12 PO), Take 1 tablet by mouth daily., Disp: , Rfl:    diclofenac  (VOLTAREN ) 75 MG EC tablet, Take 1  tablet (75 mg total) by mouth 2 (two) times daily., Disp: 30 tablet, Rfl: 1   escitalopram  (LEXAPRO ) 10 MG tablet, Take 1 tablet (10 mg total) by mouth daily., Disp: 90 tablet, Rfl: 0   losartan -hydrochlorothiazide (HYZAAR) 50-12.5 MG tablet, Take 1 tablet by mouth daily., Disp: 90 tablet, Rfl: 2   methocarbamol  (ROBAXIN ) 750 MG tablet, Take 1 tablet (750 mg total) by mouth every 8 (eight) hours as needed for muscle spasms., Disp: 90 tablet, Rfl: 0   montelukast  (SINGULAIR ) 10 MG tablet, Take 1 tablet (10 mg total) by mouth daily., Disp: 90 tablet, Rfl: 5   Multiple Vitamin (MULTIVITAMIN) tablet, Take 1 tablet by mouth daily., Disp: , Rfl:     Probiotic Product (PROBIOTIC PO), Take 1 tablet by mouth daily., Disp: , Rfl:    promethazine -dextromethorphan (PROMETHAZINE -DM) 6.25-15 MG/5ML syrup, Take 5 mLs by mouth 2 (two) times daily as needed., Disp: 118 mL, Rfl: 0  Current Facility-Administered Medications:    0.9 %  sodium chloride  infusion, 500 mL, Intravenous, Once, Abran Norleen SAILOR, MD   No Known Allergies     Social History   Tobacco Use  Smoking Status Never   Passive exposure: Never  Smokeless Tobacco Never   Social History   Substance and Sexual Activity  Alcohol Use No      Review of Systems  Constitutional: Negative.   HENT: Negative.    Eyes: Negative.   Respiratory: Negative.    Cardiovascular: Negative.   Gastrointestinal: Negative.   Endocrine: Negative.   Genitourinary: Negative.   Musculoskeletal:  Positive for back pain.  Skin: Negative.   Allergic/Immunologic: Negative.   Neurological: Negative.   Hematological: Negative.   Psychiatric/Behavioral: Negative.       Today's Vitals   03/18/24 0814  BP: 116/80  Pulse: 85  Temp: 98.4 F (36.9 C)  TempSrc: Oral  Weight: 244 lb (110.7 kg)  PainSc: 5   PainLoc: Back   Body mass index is 43.22 kg/m.  Wt Readings from Last 3 Encounters:  03/18/24 244 lb (110.7 kg)  11/05/23 240 lb (108.9 kg)  10/14/23 240 lb (108.9 kg)     Objective:  Physical Exam Constitutional:      Appearance: Normal appearance.  HENT:     Head: Normocephalic.  Cardiovascular:     Rate and Rhythm: Normal rate and regular rhythm.     Pulses: Normal pulses.     Heart sounds: Normal heart sounds.  Pulmonary:     Effort: Pulmonary effort is normal.     Breath sounds: Normal breath sounds.  Abdominal:     General: Bowel sounds are normal.  Musculoskeletal:        General: Tenderness present.  Skin:    General: Skin is warm and dry.  Neurological:     General: No focal deficit present.     Mental Status: She is alert and oriented to person, place, and time.  Mental status is at baseline.  Psychiatric:        Mood and Affect: Mood normal.         Assessment And Plan:     Encounter for general adult medical examination w/o abnormal findings  Primary hypertension -     CBC -     CMP14+EGFR  Pneumococcal vaccination declined  Herpes zoster vaccination declined  Need for shingles vaccine  Need for Tdap vaccination -     Tdap vaccine greater than or equal to 7yo IM  History of partial hysterectomy  Screening for  colon cancer -     Ambulatory referral to Gastroenterology  Encounter for screening for vascular disease -     Lipid panel  Prediabetes -     Hemoglobin A1c  Menopausal syndrome -     Escitalopram  Oxalate; Take 1 tablet (10 mg total) by mouth daily.  Dispense: 90 tablet; Refill: 0  Encounter for hepatitis C screening test for low risk patient -     Hepatitis C antibody  Encounter for screening for HIV -     HIV Antibody (routine testing w rflx)  Lumbar back pain with radiculopathy affecting left lower extremity -     Diclofenac  Sodium; Take 1 tablet (75 mg total) by mouth 2 (two) times daily.  Dispense: 30 tablet; Refill: 1 -     Methocarbamol ; Take 1 tablet (750 mg total) by mouth every 8 (eight) hours as needed for muscle spasms.  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 Advised to aim for at least 150 minutes of exercise per week.       Return for 1 year physical, 6 months. Patient was given opportunity to ask questions. Patient verbalized understanding of the plan and was able to repeat key elements of the plan. All questions were answered to their satisfaction.   I, Monique Creighton, NP, have reviewed all documentation for this visit. The documentation on 03/23/2024 for the exam, diagnosis, procedures, and orders are  all accurate and complete.

## 2024-03-19 LAB — LIPID PANEL
Chol/HDL Ratio: 2.6 ratio (ref 0.0–4.4)
Cholesterol, Total: 167 mg/dL (ref 100–199)
HDL: 65 mg/dL (ref >39)
LDL Chol Calc (NIH): 89 mg/dL (ref 0–99)
Triglycerides: 69 mg/dL (ref 0–149)
VLDL Cholesterol Cal: 13 mg/dL (ref 5–40)

## 2024-03-19 LAB — CMP14+EGFR
ALT: 21 IU/L (ref 0–32)
AST: 21 IU/L (ref 0–40)
Albumin: 4 g/dL (ref 3.8–4.9)
Alkaline Phosphatase: 103 IU/L (ref 44–121)
BUN/Creatinine Ratio: 18 (ref 9–23)
BUN: 13 mg/dL (ref 6–24)
Bilirubin Total: 0.3 mg/dL (ref 0.0–1.2)
CO2: 24 mmol/L (ref 20–29)
Calcium: 9.3 mg/dL (ref 8.7–10.2)
Chloride: 102 mmol/L (ref 96–106)
Creatinine, Ser: 0.74 mg/dL (ref 0.57–1.00)
Globulin, Total: 2.9 g/dL (ref 1.5–4.5)
Glucose: 103 mg/dL — ABNORMAL HIGH (ref 70–99)
Potassium: 3.7 mmol/L (ref 3.5–5.2)
Sodium: 142 mmol/L (ref 134–144)
Total Protein: 6.9 g/dL (ref 6.0–8.5)
eGFR: 95 mL/min/1.73 (ref >59)

## 2024-03-19 LAB — CBC
Hematocrit: 41.9 % (ref 34.0–46.6)
Hemoglobin: 13.1 g/dL (ref 11.1–15.9)
MCH: 28.3 pg (ref 26.6–33.0)
MCHC: 31.3 g/dL — ABNORMAL LOW (ref 31.5–35.7)
MCV: 91 fL (ref 79–97)
Platelets: 277 x10E3/uL (ref 150–450)
RBC: 4.63 x10E6/uL (ref 3.77–5.28)
RDW: 12.9 % (ref 11.7–15.4)
WBC: 4.8 x10E3/uL (ref 3.4–10.8)

## 2024-03-19 LAB — HEPATITIS C ANTIBODY: Hep C Virus Ab: NONREACTIVE

## 2024-03-19 LAB — HEMOGLOBIN A1C
Est. average glucose Bld gHb Est-mCnc: 128 mg/dL
Hgb A1c MFr Bld: 6.1 % — ABNORMAL HIGH (ref 4.8–5.6)

## 2024-03-19 LAB — HIV ANTIBODY (ROUTINE TESTING W REFLEX): HIV Screen 4th Generation wRfx: NONREACTIVE

## 2024-03-23 ENCOUNTER — Ambulatory Visit: Payer: Self-pay | Admitting: Family Medicine

## 2024-03-23 DIAGNOSIS — N951 Menopausal and female climacteric states: Secondary | ICD-10-CM | POA: Insufficient documentation

## 2024-03-23 DIAGNOSIS — Z114 Encounter for screening for human immunodeficiency virus [HIV]: Secondary | ICD-10-CM | POA: Insufficient documentation

## 2024-03-23 DIAGNOSIS — M5416 Radiculopathy, lumbar region: Secondary | ICD-10-CM | POA: Insufficient documentation

## 2024-03-23 DIAGNOSIS — Z1159 Encounter for screening for other viral diseases: Secondary | ICD-10-CM | POA: Insufficient documentation

## 2024-03-23 NOTE — Progress Notes (Signed)
 Your labs are stable. Iron levels, kidney function, cholesterol levels are normal. Your A1c is 6.1 dropped from 6.2. Maintain a low fat diet and exercise.   Thank you!

## 2024-03-23 NOTE — Assessment & Plan Note (Signed)
 She is encouraged to strive for BMI less than 30 to decrease cardiac risk. Advised to aim for at least 150 minutes of exercise per week.

## 2024-09-01 ENCOUNTER — Ambulatory Visit: Payer: Self-pay

## 2024-09-01 ENCOUNTER — Ambulatory Visit

## 2024-09-01 VITALS — BP 122/80 | HR 97 | Temp 99.2°F | Ht 63.0 in | Wt 229.0 lb

## 2024-09-01 DIAGNOSIS — R509 Fever, unspecified: Secondary | ICD-10-CM

## 2024-09-01 DIAGNOSIS — Z23 Encounter for immunization: Secondary | ICD-10-CM

## 2024-09-01 DIAGNOSIS — R112 Nausea with vomiting, unspecified: Secondary | ICD-10-CM

## 2024-09-01 DIAGNOSIS — Z1159 Encounter for screening for other viral diseases: Secondary | ICD-10-CM

## 2024-09-01 DIAGNOSIS — R197 Diarrhea, unspecified: Secondary | ICD-10-CM

## 2024-09-01 LAB — POC SOFIA 2 FLU + SARS ANTIGEN FIA
Influenza A, POC: NEGATIVE
Influenza B, POC: NEGATIVE
SARS Coronavirus 2 Ag: NEGATIVE

## 2024-09-01 NOTE — Telephone Encounter (Signed)
 Attempted to call patient 2x to offer appt with Dr.Andrews. LVM for patient.

## 2024-09-01 NOTE — Progress Notes (Signed)
 "  Acute Office Visit  Subjective:     Patient ID: [name], female    DOB: 1971/05/24, 54 y.o.   MRN: [id number]  Chief Complaint  Patient presents with   Nausea    X 6 days   Diarrhea   Generalized Body Aches   Headache    This is a 55 year old African-American female who is here today because she has been sick for 3 days with nausea/vomiting, diarrhea, and fevers and chills.  She has had 4 episodes of diarrhea today without any blood in the stool.  She last vomited yesterday on 08/31/24.  She has been having fevers and chills.  She last took over-the-counter Tylenol  at 10 am today.  She denies any runny nose, sore throat, sinus pressure/pain.  She does have a history of ASTHMA and does have some shortness of breath.  She has increased her albuterol  usage to 2 times a day.  She has not received her influenza vaccine this year.     Review of Systems  Constitutional:  Positive for chills and fever.       Body aches  HENT:  Negative for congestion, sinus pain and sore throat.   Respiratory:  Positive for cough (mildly productive), sputum production and shortness of breath. Negative for wheezing.        Chest tightness  Gastrointestinal:  Positive for abdominal pain (cramping periumbilical), diarrhea, nausea and vomiting. Negative for blood in stool.  Neurological:  Positive for headaches.        Objective:    Vitals:   09/01/24 1510  BP: 122/80  Pulse: 97  Temp: 99.2 F (37.3 C)  Height: 5' 3 (1.6 m)  Weight: 229 lb (103.9 kg)  SpO2: 95%  TempSrc: Oral  BMI (Calculated): 40.58      Physical Exam Vitals and nursing note reviewed.  HENT:     Head: Normocephalic and atraumatic.     Right Ear: Tympanic membrane, ear canal and external ear normal.     Left Ear: Tympanic membrane, ear canal and external ear normal.     Nose: Congestion present. No rhinorrhea.     Mouth/Throat:     Mouth: Mucous membranes are moist.     Pharynx: Oropharynx is clear. No  oropharyngeal exudate or posterior oropharyngeal erythema.  Eyes:     Conjunctiva/sclera: Conjunctivae normal.     Pupils: Pupils are equal, round, and reactive to light.  Cardiovascular:     Rate and Rhythm: Normal rate and regular rhythm.     Pulses: Normal pulses.     Heart sounds: Normal heart sounds.  Pulmonary:     Effort: Pulmonary effort is normal.     Breath sounds: Normal breath sounds. No wheezing or rales.  Abdominal:     General: Abdomen is flat.     Palpations: Abdomen is soft.     Tenderness: There is no abdominal tenderness.  Musculoskeletal:     Cervical back: Normal range of motion and neck supple.  Neurological:     Mental Status: She is alert.     Results for orders placed or performed in visit on 09/01/24  POC SOFIA 2 FLU + SARS ANTIGEN FIA  Result Value Ref Range   Influenza A, POC Negative Negative   Influenza B, POC Negative Negative   SARS Coronavirus 2 Ag Negative Negative        Assessment & Plan:   Assessment & Plan Encounter for screening for other viral diseases The rapid COVID-19 test  and rapid influenza A/B test done on 09/01/2024 were negative. Orders:   POC SOFIA 2 FLU + SARS ANTIGEN FIA  Fever and chills I will have her continue to take the over-the-counter acetaminophen  as needed. Orders:   POC SOFIA 2 FLU + SARS ANTIGEN FIA  Nausea and vomiting, unspecified vomiting type Her last episode of vomiting was on 08/31/2024.   Orders:   POC SOFIA 2 FLU + SARS ANTIGEN FIA  Diarrhea, unspecified type  Orders:   POC SOFIA 2 FLU + SARS ANTIGEN FIA  Need for influenza vaccination She was given the influenza vaccine on 09/01/2024. Orders:   Flu vaccine trivalent PF, 6mos and older(Flulaval,Afluria,Fluarix,Fluzone)   Return if symptoms worsen or fail to improve.  [place] JONELLE Fischer, DO  "

## 2024-09-01 NOTE — Telephone Encounter (Signed)
 FYI Only or Action Required?: Action required by provider: update on patient condition.  Patient was last seen in primary care on 03/18/2024 by Petrina Pries, NP.  Called Nurse Triage reporting Diarrhea, Vomiting, Fever, Cough, and Generalized Body Aches.  Symptoms began 2 days ago.  Interventions attempted: OTC medications: Tylenol  and Prescription medications: Albuterol  inhaler bid, Zofran .  Symptoms are: gradually improving.  Triage Disposition: Call PCP Within 24 Hours  Patient/caregiver understands and will follow disposition?: Yes          Copied from CRM #[id number]. Topic: Clinical - Red Word Triage >> Sep 01, 2024  8:41 AM Gustabo D wrote: Pt is having diarrhea  , vomiting, coughing, body ache and fever. Reason for Disposition  [1] HIGH RISK patient (e.g., weak immune system, 65 years and older, obesity with BMI 30 or higher, pregnant, chronic lung disease) AND [2] COVID symptoms (e.g., cough, fever)  (Exceptions: Already seen by PCP and no new or worsening symptoms.)  Answer Assessment - Initial Assessment Questions 1. SYMPTOMS: What is your main symptom or concern? (e.g., cough, fever, shortness of breath, muscle aches)     Cough, body aches.  2. ONSET: When did the symptoms start?      Tuesday.  3. COUGH: Do you have a cough? If Yes, ask: How bad is the cough?       Yes. Productive, clear cough. Started yesterday, constant but not severe or causing vomiting.  4. FEVER: Do you have a fever? If Yes, ask: What is your temperature, how was it measured, and when did it start?     No, did not check.  5. BREATHING DIFFICULTY: Are you having any difficulty breathing? (e.g., normal; shortness of breath, wheezing, unable to speak)      No. She has a history of asthma and using albuterol  inhaler twice daily.  6. BETTER-SAME-WORSE: Are you getting better, staying the same or getting worse compared to yesterday?  If getting worse, ask, In what way?      Better.  7. OTHER SYMPTOMS: Do you have any other symptoms?  (e.g., chills, fatigue, headache, loss of smell or taste, muscle pain, sore throat)     Nausea, vomiting (last episode yesterday, 0 episodes in past 24 hours) diarrhea (6 episodes in past 24 hours), chills, red eyes.  8. COVID-19 DIAGNOSIS: How do you know that you have COVID? (e.g., positive lab test or self-test, diagnosed by doctor or NP/PA, symptoms after exposure).     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  9. COVID-19 EXPOSURE: Was there any known exposure to COVID before the symptoms began?      No.  10. COVID-19 VACCINE: Have you had the COVID-19 vaccine? If Yes, ask: When did you last get it?       Yes. Last received more than a few years ago.  11. HIGH RISK DISEASE: Do you have any chronic medical problems? (e.g., asthma, heart or lung disease, weak immune system, obesity, etc.)       Asthma.  12. PREGNANCY: Is there any chance you are pregnant? When was your last menstrual period?       N/A.  13. O2 SATURATION MONITOR:  Do you use an oxygen saturation monitor (pulse oximeter) at home? If Yes, ask What is your reading (oxygen level) today? What is your usual oxygen saturation reading? (e.g., 95%)       No.  Protocols used: COVID-19 - Diagnosed or Suspected-A-AH

## 2024-09-21 ENCOUNTER — Ambulatory Visit: Payer: Self-pay | Admitting: Family Medicine

## 2025-03-29 ENCOUNTER — Encounter: Payer: Self-pay | Admitting: Family Medicine
